# Patient Record
Sex: Female | Born: 1940
Health system: Southern US, Community
[De-identification: ages and names within clinical notes are randomized; demographics above are authoritative.]

## PROBLEM LIST (undated history)

## (undated) DIAGNOSIS — R519 Headache, unspecified: Secondary | ICD-10-CM

## (undated) DIAGNOSIS — N390 Urinary tract infection, site not specified: Secondary | ICD-10-CM

## (undated) DIAGNOSIS — M810 Age-related osteoporosis without current pathological fracture: Secondary | ICD-10-CM

## (undated) HISTORY — DX: Age-related osteoporosis without current pathological fracture: M81.0

## (undated) HISTORY — PX: ABDOMINAL HYSTERECTOMY: SHX81

## (undated) HISTORY — DX: Headache, unspecified: R51.9

---

## 2001-09-24 ENCOUNTER — Other Ambulatory Visit: Admission: RE | Admit: 2001-09-24 | Discharge: 2001-09-24 | Payer: Self-pay | Admitting: Family Medicine

## 2002-10-29 ENCOUNTER — Encounter: Payer: Self-pay | Admitting: Family Medicine

## 2002-10-29 ENCOUNTER — Encounter: Admission: RE | Admit: 2002-10-29 | Discharge: 2002-10-29 | Payer: Self-pay | Admitting: Family Medicine

## 2003-11-08 ENCOUNTER — Encounter: Admission: RE | Admit: 2003-11-08 | Discharge: 2003-11-08 | Payer: Self-pay | Admitting: Internal Medicine

## 2004-01-03 ENCOUNTER — Other Ambulatory Visit: Admission: RE | Admit: 2004-01-03 | Discharge: 2004-01-03 | Payer: Self-pay | Admitting: Internal Medicine

## 2004-03-27 ENCOUNTER — Ambulatory Visit (HOSPITAL_COMMUNITY): Admission: RE | Admit: 2004-03-27 | Discharge: 2004-03-27 | Payer: Self-pay | Admitting: Gastroenterology

## 2005-01-22 ENCOUNTER — Encounter: Admission: RE | Admit: 2005-01-22 | Discharge: 2005-01-22 | Payer: Self-pay | Admitting: Internal Medicine

## 2006-06-10 ENCOUNTER — Encounter: Admission: RE | Admit: 2006-06-10 | Discharge: 2006-06-10 | Payer: Self-pay | Admitting: Internal Medicine

## 2006-06-10 ENCOUNTER — Other Ambulatory Visit: Admission: RE | Admit: 2006-06-10 | Discharge: 2006-06-10 | Payer: Self-pay | Admitting: Internal Medicine

## 2007-07-24 ENCOUNTER — Encounter: Admission: RE | Admit: 2007-07-24 | Discharge: 2007-07-24 | Payer: Self-pay | Admitting: Internal Medicine

## 2008-10-08 ENCOUNTER — Encounter: Admission: RE | Admit: 2008-10-08 | Discharge: 2008-10-08 | Payer: Self-pay | Admitting: Internal Medicine

## 2009-07-24 ENCOUNTER — Emergency Department (HOSPITAL_COMMUNITY): Admission: EM | Admit: 2009-07-24 | Discharge: 2009-07-24 | Payer: Self-pay | Admitting: Family Medicine

## 2009-11-18 ENCOUNTER — Encounter: Admission: RE | Admit: 2009-11-18 | Discharge: 2009-11-18 | Payer: Self-pay | Admitting: Internal Medicine

## 2010-12-12 ENCOUNTER — Other Ambulatory Visit: Payer: Self-pay | Admitting: Internal Medicine

## 2010-12-12 DIAGNOSIS — Z1231 Encounter for screening mammogram for malignant neoplasm of breast: Secondary | ICD-10-CM

## 2011-01-02 ENCOUNTER — Ambulatory Visit
Admission: RE | Admit: 2011-01-02 | Discharge: 2011-01-02 | Disposition: A | Discharge status: Home / Self Care | Payer: Medicare Other | Source: Ambulatory Visit | Attending: Internal Medicine | Admitting: Internal Medicine

## 2011-01-02 DIAGNOSIS — Z1231 Encounter for screening mammogram for malignant neoplasm of breast: Secondary | ICD-10-CM

## 2011-01-19 NOTE — Op Note (Signed)
NAME:  Regina Moody, XU                       ACCOUNT NO.:  [id number]   MEDICAL RECORD NO.:  [id number]                   PATIENT TYPE:  AMB   LOCATION:  ENDO                                 FACILITY:  [facility]   PHYSICIAN:  Danise Edge, M.D.                DATE OF BIRTH:  [birth year]   DATE OF PROCEDURE:  03/27/2004  DATE OF DISCHARGE:                                 OPERATIVE REPORT   PROCEDURE:  Incomplete colonoscopy.   PROCEDURE INDICATION:  [name] is a 70 year old female, born  [birth year].  [name] is scheduled to undergo her first  screening colonoscopy with polypectomy to prevent colon cancer.  Her son  died at age 68 of colon cancer.  [name] has undergone a total  abdominal hysterectomy.   ENDOSCOPIST:  Danise Edge, M.D.   PREMEDICATION:  1. Versed 5 mg.  2. Demerol 50 mg.   DESCRIPTION OF PROCEDURE:  After obtaining informed consent, [name]  was placed in the left lateral decubitus position.  I administered  intravenous Demerol and intravenous Versed to achieve conscious sedation for  the procedure.  The patient's blood pressure, oxygen saturation, and cardiac  rhythm were monitored throughout the procedure and documented in the medical  record.   Anal inspection and digital rectal exam were normal.  The Olympus adjustable  pediatric colonoscope was introduced into the rectum and advanced only to 30  cm.  Due to probable adhesions from her previous hysterectomy, I was unable  to complete a full colonoscopy.   Endoscopic appearance of the rectum and distal sigmoid colon was normal.  There was no endoscopic evidence for the presence of colorectal neoplasia.   I am referring [name] to the [facility] for  an air contrast barium enema.                                               Danise Edge, M.D.    MJ/MEDQ  D:  03/27/2004  T:  03/27/2004  Job:  [id number]   cc:   Georgann Housekeeper, M.D.  [address]  [place]  [place] [address]  Fax: [phone]

## 2011-09-18 DIAGNOSIS — E559 Vitamin D deficiency, unspecified: Secondary | ICD-10-CM | POA: Diagnosis not present

## 2011-09-18 DIAGNOSIS — M949 Disorder of cartilage, unspecified: Secondary | ICD-10-CM | POA: Diagnosis not present

## 2011-09-18 DIAGNOSIS — Z23 Encounter for immunization: Secondary | ICD-10-CM | POA: Diagnosis not present

## 2011-09-18 DIAGNOSIS — J309 Allergic rhinitis, unspecified: Secondary | ICD-10-CM | POA: Diagnosis not present

## 2011-09-18 DIAGNOSIS — E785 Hyperlipidemia, unspecified: Secondary | ICD-10-CM | POA: Diagnosis not present

## 2011-09-18 DIAGNOSIS — R319 Hematuria, unspecified: Secondary | ICD-10-CM | POA: Diagnosis not present

## 2011-09-18 DIAGNOSIS — Z0000 Encounter for general adult medical examination without abnormal findings: Secondary | ICD-10-CM | POA: Diagnosis not present

## 2011-10-09 DIAGNOSIS — H52209 Unspecified astigmatism, unspecified eye: Secondary | ICD-10-CM | POA: Diagnosis not present

## 2011-10-09 DIAGNOSIS — H25019 Cortical age-related cataract, unspecified eye: Secondary | ICD-10-CM | POA: Diagnosis not present

## 2011-11-02 DIAGNOSIS — J392 Other diseases of pharynx: Secondary | ICD-10-CM | POA: Diagnosis not present

## 2011-11-02 DIAGNOSIS — J069 Acute upper respiratory infection, unspecified: Secondary | ICD-10-CM | POA: Diagnosis not present

## 2011-11-02 DIAGNOSIS — J210 Acute bronchiolitis due to respiratory syncytial virus: Secondary | ICD-10-CM | POA: Diagnosis not present

## 2011-11-07 DIAGNOSIS — J019 Acute sinusitis, unspecified: Secondary | ICD-10-CM | POA: Diagnosis not present

## 2011-11-22 DIAGNOSIS — R05 Cough: Secondary | ICD-10-CM | POA: Diagnosis not present

## 2012-08-12 DIAGNOSIS — L989 Disorder of the skin and subcutaneous tissue, unspecified: Secondary | ICD-10-CM | POA: Diagnosis not present

## 2012-08-14 DIAGNOSIS — C44721 Squamous cell carcinoma of skin of unspecified lower limb, including hip: Secondary | ICD-10-CM | POA: Diagnosis not present

## 2012-09-11 ENCOUNTER — Other Ambulatory Visit: Payer: Self-pay | Admitting: Internal Medicine

## 2012-09-11 DIAGNOSIS — Z1231 Encounter for screening mammogram for malignant neoplasm of breast: Secondary | ICD-10-CM

## 2012-09-15 DIAGNOSIS — Z23 Encounter for immunization: Secondary | ICD-10-CM | POA: Diagnosis not present

## 2012-09-23 DIAGNOSIS — Z1331 Encounter for screening for depression: Secondary | ICD-10-CM | POA: Diagnosis not present

## 2012-09-23 DIAGNOSIS — R21 Rash and other nonspecific skin eruption: Secondary | ICD-10-CM | POA: Diagnosis not present

## 2012-09-23 DIAGNOSIS — M949 Disorder of cartilage, unspecified: Secondary | ICD-10-CM | POA: Diagnosis not present

## 2012-09-23 DIAGNOSIS — E785 Hyperlipidemia, unspecified: Secondary | ICD-10-CM | POA: Diagnosis not present

## 2012-09-23 DIAGNOSIS — Z0000 Encounter for general adult medical examination without abnormal findings: Secondary | ICD-10-CM | POA: Diagnosis not present

## 2012-10-06 ENCOUNTER — Ambulatory Visit
Admission: RE | Admit: 2012-10-06 | Discharge: 2012-10-06 | Disposition: A | Discharge status: Home / Self Care | Payer: Medicare Other | Source: Ambulatory Visit | Attending: Internal Medicine | Admitting: Internal Medicine

## 2012-10-06 DIAGNOSIS — Z1231 Encounter for screening mammogram for malignant neoplasm of breast: Secondary | ICD-10-CM | POA: Diagnosis not present

## 2012-10-06 DIAGNOSIS — L578 Other skin changes due to chronic exposure to nonionizing radiation: Secondary | ICD-10-CM | POA: Diagnosis not present

## 2012-10-06 DIAGNOSIS — Z85828 Personal history of other malignant neoplasm of skin: Secondary | ICD-10-CM | POA: Diagnosis not present

## 2012-10-27 DIAGNOSIS — M949 Disorder of cartilage, unspecified: Secondary | ICD-10-CM | POA: Diagnosis not present

## 2012-11-10 DIAGNOSIS — J019 Acute sinusitis, unspecified: Secondary | ICD-10-CM | POA: Diagnosis not present

## 2012-12-23 DIAGNOSIS — L570 Actinic keratosis: Secondary | ICD-10-CM | POA: Diagnosis not present

## 2013-02-03 DIAGNOSIS — L570 Actinic keratosis: Secondary | ICD-10-CM | POA: Diagnosis not present

## 2013-02-03 DIAGNOSIS — L578 Other skin changes due to chronic exposure to nonionizing radiation: Secondary | ICD-10-CM | POA: Diagnosis not present

## 2013-08-12 DIAGNOSIS — L570 Actinic keratosis: Secondary | ICD-10-CM | POA: Diagnosis not present

## 2013-08-12 DIAGNOSIS — D485 Neoplasm of uncertain behavior of skin: Secondary | ICD-10-CM | POA: Diagnosis not present

## 2013-08-12 DIAGNOSIS — C44721 Squamous cell carcinoma of skin of unspecified lower limb, including hip: Secondary | ICD-10-CM | POA: Diagnosis not present

## 2013-09-01 DIAGNOSIS — Z23 Encounter for immunization: Secondary | ICD-10-CM | POA: Diagnosis not present

## 2013-09-25 DIAGNOSIS — Z0000 Encounter for general adult medical examination without abnormal findings: Secondary | ICD-10-CM | POA: Diagnosis not present

## 2013-09-25 DIAGNOSIS — M899 Disorder of bone, unspecified: Secondary | ICD-10-CM | POA: Diagnosis not present

## 2013-09-25 DIAGNOSIS — M949 Disorder of cartilage, unspecified: Secondary | ICD-10-CM | POA: Diagnosis not present

## 2013-09-25 DIAGNOSIS — Z1331 Encounter for screening for depression: Secondary | ICD-10-CM | POA: Diagnosis not present

## 2013-09-25 DIAGNOSIS — E785 Hyperlipidemia, unspecified: Secondary | ICD-10-CM | POA: Diagnosis not present

## 2013-09-28 DIAGNOSIS — Z85828 Personal history of other malignant neoplasm of skin: Secondary | ICD-10-CM | POA: Diagnosis not present

## 2013-09-28 DIAGNOSIS — L570 Actinic keratosis: Secondary | ICD-10-CM | POA: Diagnosis not present

## 2013-11-18 ENCOUNTER — Other Ambulatory Visit: Payer: Self-pay

## 2013-11-18 DIAGNOSIS — Z1231 Encounter for screening mammogram for malignant neoplasm of breast: Secondary | ICD-10-CM

## 2013-11-30 ENCOUNTER — Ambulatory Visit
Admission: RE | Admit: 2013-11-30 | Discharge: 2013-11-30 | Disposition: A | Discharge status: Home / Self Care | Payer: Medicare Other | Source: Ambulatory Visit

## 2013-11-30 DIAGNOSIS — Z1231 Encounter for screening mammogram for malignant neoplasm of breast: Secondary | ICD-10-CM | POA: Diagnosis not present

## 2014-03-29 DIAGNOSIS — Z85828 Personal history of other malignant neoplasm of skin: Secondary | ICD-10-CM | POA: Diagnosis not present

## 2014-03-29 DIAGNOSIS — L570 Actinic keratosis: Secondary | ICD-10-CM | POA: Diagnosis not present

## 2014-04-23 DIAGNOSIS — N200 Calculus of kidney: Secondary | ICD-10-CM | POA: Diagnosis not present

## 2014-05-04 DIAGNOSIS — N3020 Other chronic cystitis without hematuria: Secondary | ICD-10-CM | POA: Diagnosis not present

## 2014-05-07 DIAGNOSIS — R05 Cough: Secondary | ICD-10-CM | POA: Diagnosis not present

## 2014-05-07 DIAGNOSIS — J309 Allergic rhinitis, unspecified: Secondary | ICD-10-CM | POA: Diagnosis not present

## 2014-05-07 DIAGNOSIS — R059 Cough, unspecified: Secondary | ICD-10-CM | POA: Diagnosis not present

## 2014-11-11 DIAGNOSIS — M8580 Other specified disorders of bone density and structure, unspecified site: Secondary | ICD-10-CM | POA: Diagnosis not present

## 2014-11-11 DIAGNOSIS — N3020 Other chronic cystitis without hematuria: Secondary | ICD-10-CM | POA: Diagnosis not present

## 2014-11-11 DIAGNOSIS — Z0000 Encounter for general adult medical examination without abnormal findings: Secondary | ICD-10-CM | POA: Diagnosis not present

## 2014-11-11 DIAGNOSIS — J309 Allergic rhinitis, unspecified: Secondary | ICD-10-CM | POA: Diagnosis not present

## 2014-11-11 DIAGNOSIS — E780 Pure hypercholesterolemia: Secondary | ICD-10-CM | POA: Diagnosis not present

## 2014-11-11 DIAGNOSIS — Z1389 Encounter for screening for other disorder: Secondary | ICD-10-CM | POA: Diagnosis not present

## 2014-11-11 DIAGNOSIS — Z23 Encounter for immunization: Secondary | ICD-10-CM | POA: Diagnosis not present

## 2014-11-11 DIAGNOSIS — R312 Other microscopic hematuria: Secondary | ICD-10-CM | POA: Diagnosis not present

## 2014-11-15 DIAGNOSIS — Z1211 Encounter for screening for malignant neoplasm of colon: Secondary | ICD-10-CM | POA: Diagnosis not present

## 2014-11-15 DIAGNOSIS — L570 Actinic keratosis: Secondary | ICD-10-CM | POA: Diagnosis not present

## 2014-12-13 DIAGNOSIS — L570 Actinic keratosis: Secondary | ICD-10-CM | POA: Diagnosis not present

## 2014-12-27 DIAGNOSIS — H25013 Cortical age-related cataract, bilateral: Secondary | ICD-10-CM | POA: Diagnosis not present

## 2014-12-27 DIAGNOSIS — H5203 Hypermetropia, bilateral: Secondary | ICD-10-CM | POA: Diagnosis not present

## 2015-01-28 ENCOUNTER — Other Ambulatory Visit: Payer: Self-pay

## 2015-01-28 DIAGNOSIS — Z1231 Encounter for screening mammogram for malignant neoplasm of breast: Secondary | ICD-10-CM

## 2015-02-02 ENCOUNTER — Ambulatory Visit
Admission: RE | Admit: 2015-02-02 | Discharge: 2015-02-02 | Disposition: A | Discharge status: Home / Self Care | Payer: Medicare Other | Source: Ambulatory Visit

## 2015-02-02 DIAGNOSIS — Z1231 Encounter for screening mammogram for malignant neoplasm of breast: Secondary | ICD-10-CM | POA: Diagnosis not present

## 2015-02-02 DIAGNOSIS — L570 Actinic keratosis: Secondary | ICD-10-CM | POA: Diagnosis not present

## 2015-05-26 DIAGNOSIS — L570 Actinic keratosis: Secondary | ICD-10-CM | POA: Diagnosis not present

## 2015-07-18 DIAGNOSIS — J040 Acute laryngitis: Secondary | ICD-10-CM | POA: Diagnosis not present

## 2015-07-18 DIAGNOSIS — J069 Acute upper respiratory infection, unspecified: Secondary | ICD-10-CM | POA: Diagnosis not present

## 2015-10-10 DIAGNOSIS — L578 Other skin changes due to chronic exposure to nonionizing radiation: Secondary | ICD-10-CM | POA: Diagnosis not present

## 2015-10-10 DIAGNOSIS — L570 Actinic keratosis: Secondary | ICD-10-CM | POA: Diagnosis not present

## 2015-10-29 DIAGNOSIS — J3089 Other allergic rhinitis: Secondary | ICD-10-CM | POA: Diagnosis not present

## 2015-11-14 DIAGNOSIS — Z1389 Encounter for screening for other disorder: Secondary | ICD-10-CM | POA: Diagnosis not present

## 2015-11-14 DIAGNOSIS — M8580 Other specified disorders of bone density and structure, unspecified site: Secondary | ICD-10-CM | POA: Diagnosis not present

## 2015-11-14 DIAGNOSIS — Z0000 Encounter for general adult medical examination without abnormal findings: Secondary | ICD-10-CM | POA: Diagnosis not present

## 2015-11-14 DIAGNOSIS — G43909 Migraine, unspecified, not intractable, without status migrainosus: Secondary | ICD-10-CM | POA: Diagnosis not present

## 2015-11-14 DIAGNOSIS — N3020 Other chronic cystitis without hematuria: Secondary | ICD-10-CM | POA: Diagnosis not present

## 2015-11-14 DIAGNOSIS — J309 Allergic rhinitis, unspecified: Secondary | ICD-10-CM | POA: Diagnosis not present

## 2015-11-14 DIAGNOSIS — E7800 Pure hypercholesterolemia, unspecified: Secondary | ICD-10-CM | POA: Diagnosis not present

## 2015-11-14 DIAGNOSIS — R3121 Asymptomatic microscopic hematuria: Secondary | ICD-10-CM | POA: Diagnosis not present

## 2015-11-22 DIAGNOSIS — J069 Acute upper respiratory infection, unspecified: Secondary | ICD-10-CM | POA: Diagnosis not present

## 2015-11-22 DIAGNOSIS — J329 Chronic sinusitis, unspecified: Secondary | ICD-10-CM | POA: Diagnosis not present

## 2016-01-02 DIAGNOSIS — M810 Age-related osteoporosis without current pathological fracture: Secondary | ICD-10-CM | POA: Diagnosis not present

## 2016-01-12 DIAGNOSIS — L570 Actinic keratosis: Secondary | ICD-10-CM | POA: Diagnosis not present

## 2016-01-12 DIAGNOSIS — L821 Other seborrheic keratosis: Secondary | ICD-10-CM | POA: Diagnosis not present

## 2016-01-12 DIAGNOSIS — D1801 Hemangioma of skin and subcutaneous tissue: Secondary | ICD-10-CM | POA: Diagnosis not present

## 2016-03-05 ENCOUNTER — Other Ambulatory Visit: Payer: Self-pay | Admitting: Internal Medicine

## 2016-03-05 DIAGNOSIS — Z1231 Encounter for screening mammogram for malignant neoplasm of breast: Secondary | ICD-10-CM

## 2016-03-07 ENCOUNTER — Encounter (HOSPITAL_COMMUNITY): Payer: Self-pay

## 2016-03-07 ENCOUNTER — Emergency Department (HOSPITAL_COMMUNITY)
Admission: EM | Admit: 2016-03-07 | Discharge: 2016-03-07 | Disposition: A | Discharge status: Home / Self Care | Payer: Medicare Other | Attending: Emergency Medicine | Admitting: Emergency Medicine

## 2016-03-07 DIAGNOSIS — R531 Weakness: Secondary | ICD-10-CM | POA: Insufficient documentation

## 2016-03-07 DIAGNOSIS — R112 Nausea with vomiting, unspecified: Secondary | ICD-10-CM | POA: Diagnosis not present

## 2016-03-07 LAB — HEPATIC FUNCTION PANEL
ALK PHOS: 63 U/L (ref 38–126)
ALT: 15 U/L (ref 14–54)
AST: 25 U/L (ref 15–41)
Albumin: 3.8 g/dL (ref 3.5–5.0)
BILIRUBIN DIRECT: 0.1 mg/dL (ref 0.1–0.5)
BILIRUBIN INDIRECT: 0.5 mg/dL (ref 0.3–0.9)
BILIRUBIN TOTAL: 0.6 mg/dL (ref 0.3–1.2)
TOTAL PROTEIN: 6.6 g/dL (ref 6.5–8.1)

## 2016-03-07 LAB — URINALYSIS, ROUTINE W REFLEX MICROSCOPIC
Bilirubin Urine: NEGATIVE
GLUCOSE, UA: NEGATIVE mg/dL
Ketones, ur: NEGATIVE mg/dL
LEUKOCYTES UA: NEGATIVE
Nitrite: NEGATIVE
PROTEIN: NEGATIVE mg/dL
Specific Gravity, Urine: 1.01 (ref 1.005–1.030)
pH: 7 (ref 5.0–8.0)

## 2016-03-07 LAB — BASIC METABOLIC PANEL
ANION GAP: 6 (ref 5–15)
BUN: 14 mg/dL (ref 6–20)
CHLORIDE: 107 mmol/L (ref 101–111)
CO2: 27 mmol/L (ref 22–32)
CREATININE: 0.7 mg/dL (ref 0.44–1.00)
Calcium: 9.6 mg/dL (ref 8.9–10.3)
GFR calc non Af Amer: >60 mL/min (ref >60)
Glucose, Bld: 102 mg/dL — ABNORMAL HIGH (ref 65–99)
POTASSIUM: 4.6 mmol/L (ref 3.5–5.1)
SODIUM: 140 mmol/L (ref 135–145)

## 2016-03-07 LAB — URINE MICROSCOPIC-ADD ON: Squamous Epithelial / LPF: NONE SEEN

## 2016-03-07 LAB — CBC
HEMATOCRIT: 40.8 % (ref 36.0–46.0)
Hemoglobin: 13.3 g/dL (ref 12.0–15.0)
MCH: 31.4 pg (ref 26.0–34.0)
MCHC: 32.6 g/dL (ref 30.0–36.0)
MCV: 96.2 fL (ref 78.0–100.0)
PLATELETS: 202 10*3/uL (ref 150–400)
RBC: 4.24 MIL/uL (ref 3.87–5.11)
RDW: 13.4 % (ref 11.5–15.5)
WBC: 6.8 10*3/uL (ref 4.0–10.5)

## 2016-03-07 LAB — CBG MONITORING, ED: Glucose-Capillary: 117 mg/dL — ABNORMAL HIGH (ref 65–99)

## 2016-03-07 LAB — LIPASE, BLOOD: LIPASE: 24 U/L (ref 11–51)

## 2016-03-07 LAB — TSH: TSH: 1.144 u[IU]/mL (ref 0.350–4.500)

## 2016-03-07 LAB — I-STAT TROPONIN, ED: Troponin i, poc: 0 ng/mL (ref 0.00–0.08)

## 2016-03-07 MED ORDER — SODIUM CHLORIDE 0.9 % IV BOLUS (SEPSIS)
1000.0000 mL | Freq: Once | INTRAVENOUS | Status: AC
  Administered 2016-03-07: 1000 mL via INTRAVENOUS

## 2016-03-07 NOTE — Discharge Instructions (Signed)
Follow up with your family doc. Follow up with urology for hematuria.   Weakness Weakness is a lack of strength. It may be felt all over the body (generalized) or in one specific part of the body (focal). Some causes of weakness can be serious. You may need further medical evaluation, especially if you are elderly or you have a history of immunosuppression (such as chemotherapy or HIV), kidney disease, heart disease, or diabetes. CAUSES  Weakness can be caused by many different things, including:  Infection.  Physical exhaustion.  Internal bleeding or other blood loss that results in a lack of red blood cells (anemia).  Dehydration. This cause is more common in elderly people.  Side effects or electrolyte abnormalities from medicines, such as pain medicines or sedatives.  Emotional distress, anxiety, or depression.  Circulation problems, especially severe peripheral arterial disease.  Heart disease, such as rapid atrial fibrillation, bradycardia, or heart failure.  Nervous system disorders, such as Guillain-Barr syndrome, multiple sclerosis, or stroke. DIAGNOSIS  To find the cause of your weakness, your caregiver will take your history and perform a physical exam. Lab tests or X-rays may also be ordered, if needed. TREATMENT  Treatment of weakness depends on the cause of your symptoms and can vary greatly. HOME CARE INSTRUCTIONS   Rest as needed.  Eat a well-balanced diet.  Try to get some exercise every day.  Only take over-the-counter or prescription medicines as directed by your caregiver. SEEK MEDICAL CARE IF:   Your weakness seems to be getting worse or spreads to other parts of your body.  You develop new aches or pains. SEEK IMMEDIATE MEDICAL CARE IF:   You cannot perform your normal daily activities, such as getting dressed and feeding yourself.  You cannot walk up and down stairs, or you feel exhausted when you do so.  You have shortness of breath or chest  pain.  You have difficulty moving parts of your body.  You have weakness in only one area of the body or on only one side of the body.  You have a fever.  You have trouble speaking or swallowing.  You cannot control your bladder or bowel movements.  You have black or bloody vomit or stools. MAKE SURE YOU:  Understand these instructions.  Will watch your condition.  Will get help right away if you are not doing well or get worse.   This information is not intended to replace advice given to you by your health care provider. Make sure you discuss any questions you have with your health care provider.   Document Released: 08/20/2005 Document Revised: 02/19/2012 Document Reviewed: 10/19/2011 Elsevier Interactive Patient Education [employer].

## 2016-03-07 NOTE — ED Notes (Addendum)
Patient complains of intermittent hypotension, chills, and weakness since Thursday. Denies pain. States that she has never had these episodes previously. On arrival alert and oriented, no complaints, states that she is checking her BP every 6 hours

## 2016-03-07 NOTE — ED Provider Notes (Signed)
CSN: [id number]     Arrival date & time 03/07/16  0957 History   First MD Initiated Contact with Patient 03/07/16 1002     Chief Complaint  Patient presents with  . Nausea  . Weakness     (Consider location/radiation/quality/duration/timing/severity/associated sxs/prior Treatment) Patient is a 75 y.o. female presenting with weakness. The history is provided by the patient.  Weakness This is a new problem. The current episode started less than 1 hour ago. The problem occurs constantly. The problem has not changed since onset.Pertinent negatives include no chest pain, no abdominal pain, no headaches and no shortness of breath. Nothing aggravates the symptoms. Nothing relieves the symptoms. She has tried nothing for the symptoms. The treatment provided no relief.    75 yo F with weakness for 1 week.  Worsening with standing. Denies chest pain, sob, cough, fever, abdominal pain, nausea, vomiting, diarrhea. Denies dark stools or blood in stool.   History reviewed. No pertinent past medical history. History reviewed. No pertinent past surgical history. No family history on file. Social History  Substance Use Topics  . Smoking status: Never Smoker   . Smokeless tobacco: None  . Alcohol Use: None   OB History    No data available     Review of Systems  Constitutional: Positive for fatigue. Negative for fever and chills.  HENT: Negative for congestion and rhinorrhea.   Eyes: Negative for redness and visual disturbance.  Respiratory: Negative for shortness of breath and wheezing.   Cardiovascular: Negative for chest pain and palpitations.  Gastrointestinal: Positive for nausea. Negative for vomiting and abdominal pain.  Genitourinary: Negative for dysuria and urgency.  Musculoskeletal: Negative for myalgias and arthralgias.  Skin: Negative for pallor and wound.  Neurological: Positive for weakness and light-headedness. Negative for dizziness and headaches.      Allergies  Review  of patient's allergies indicates no known allergies.  Home Medications   Prior to Admission medications   Medication Sig Start Date End Date Taking? Authorizing Provider  ibuprofen (ADVIL) 200 MG tablet Take 200 mg by mouth every 6 (six) hours as needed for mild pain.   Yes Historical Provider, MD   BP 107/58 mmHg  Pulse 64  Temp(Src) 97.6 F (36.4 C) (Oral)  Resp 9  Ht 4\' 11"  (1.499 m)  Wt 97 lb (43.999 kg)  BMI 19.58 kg/m2  SpO2 100% Physical Exam  Constitutional: She is oriented to person, place, and time. She appears well-developed and well-nourished. No distress.  HENT:  Head: Normocephalic and atraumatic.  Eyes: EOM are normal. Pupils are equal, round, and reactive to light.  Neck: Normal range of motion. Neck supple.  Cardiovascular: Normal rate and regular rhythm.  Exam reveals no gallop and no friction rub.   No murmur heard. Pulmonary/Chest: Effort normal. She has no wheezes. She has no rales.  Abdominal: Soft. She exhibits no distension. There is no tenderness.  Musculoskeletal: She exhibits no edema or tenderness.  Neurological: She is alert and oriented to person, place, and time.  Skin: Skin is warm and dry. She is not diaphoretic.  Psychiatric: She has a normal mood and affect. Her behavior is normal.  Nursing note and vitals reviewed.   ED Course  Procedures (including critical care time) Labs Review Labs Reviewed  BASIC METABOLIC PANEL - Abnormal; Notable for the following:    Glucose, Bld 102 (*)    All other components within normal limits  URINALYSIS, ROUTINE W REFLEX MICROSCOPIC (NOT AT [facility]) - Abnormal; Notable for  the following:    Hgb urine dipstick LARGE (*)    All other components within normal limits  URINE MICROSCOPIC-ADD ON - Abnormal; Notable for the following:    Bacteria, UA RARE (*)    All other components within normal limits  CBG MONITORING, ED - Abnormal; Notable for the following:    Glucose-Capillary 117 (*)    All other  components within normal limits  CBC  HEPATIC FUNCTION PANEL  LIPASE, BLOOD  TSH  I-STAT TROPOININ, ED    Imaging Review No results found. I have personally reviewed and evaluated these images and lab results as part of my medical decision-making.   EKG Interpretation   Date/Time:  Wednesday March 07 2016 10:02:46 EDT Ventricular Rate:  80 PR Interval:  128 QRS Duration: 76 QT Interval:  366 QTC Calculation: 422 R Axis:   77 Text Interpretation:  Normal sinus rhythm Normal ECG No old tracing to  compare Confirmed by Daegan Arizmendi MD, DANIEL [phone]) on 03/07/2016 10:07:15 AM      MDM   Final diagnoses:  Weakness    75 yo F with fatigue.  Patient had a laboratory workup done here with hematuria but no infection. Otherwise reassuring. Patient will follow with her family doctor and urology.  1:52 PM:  I have discussed the diagnosis/risks/treatment options with the patient and believe the pt to be eligible for discharge home to follow-up with PCP. We also discussed returning to the ED immediately if new or worsening sx occur. We discussed the sx which are most concerning (e.g., sudden worsening pain, fever, inability to tolerate by mouth) that necessitate immediate return. Medications administered to the patient during their visit and any new prescriptions provided to the patient are listed below.  Medications given during this visit Medications  sodium chloride 0.9 % bolus 1,000 mL (0 mLs Intravenous Stopped 03/07/16 1250)    Discharge Medication List as of 03/07/2016 12:41 PM      The patient appears reasonably screen and/or stabilized for discharge and I doubt any other medical condition or other [facility] requiring further screening, evaluation, or treatment in the ED at this time prior to discharge.      Deno Etienne, DO 03/07/16 1352

## 2016-03-15 ENCOUNTER — Ambulatory Visit
Admission: RE | Admit: 2016-03-15 | Discharge: 2016-03-15 | Disposition: A | Discharge status: Home / Self Care | Payer: Medicare Other | Source: Ambulatory Visit | Attending: Internal Medicine | Admitting: Internal Medicine

## 2016-03-15 DIAGNOSIS — H2513 Age-related nuclear cataract, bilateral: Secondary | ICD-10-CM | POA: Diagnosis not present

## 2016-03-15 DIAGNOSIS — Z1231 Encounter for screening mammogram for malignant neoplasm of breast: Secondary | ICD-10-CM | POA: Diagnosis not present

## 2016-03-15 DIAGNOSIS — H5203 Hypermetropia, bilateral: Secondary | ICD-10-CM | POA: Diagnosis not present

## 2016-04-09 DIAGNOSIS — L570 Actinic keratosis: Secondary | ICD-10-CM | POA: Diagnosis not present

## 2016-04-13 DIAGNOSIS — M545 Low back pain: Secondary | ICD-10-CM | POA: Diagnosis not present

## 2016-07-02 DIAGNOSIS — N390 Urinary tract infection, site not specified: Secondary | ICD-10-CM | POA: Diagnosis not present

## 2016-07-02 DIAGNOSIS — Z23 Encounter for immunization: Secondary | ICD-10-CM | POA: Diagnosis not present

## 2016-10-08 DIAGNOSIS — L570 Actinic keratosis: Secondary | ICD-10-CM | POA: Diagnosis not present

## 2016-10-30 ENCOUNTER — Encounter (HOSPITAL_COMMUNITY): Payer: Self-pay | Admitting: *Deleted

## 2016-10-30 ENCOUNTER — Emergency Department (HOSPITAL_COMMUNITY)
Admission: EM | Admit: 2016-10-30 | Discharge: 2016-10-30 | Disposition: A | Discharge status: Home / Self Care | Payer: Medicare Other | Attending: Emergency Medicine | Admitting: Emergency Medicine

## 2016-10-30 ENCOUNTER — Emergency Department (HOSPITAL_COMMUNITY): Payer: Medicare Other

## 2016-10-30 DIAGNOSIS — X501XXA Overexertion from prolonged static or awkward postures, initial encounter: Secondary | ICD-10-CM | POA: Insufficient documentation

## 2016-10-30 DIAGNOSIS — S93402A Sprain of unspecified ligament of left ankle, initial encounter: Secondary | ICD-10-CM

## 2016-10-30 DIAGNOSIS — Y929 Unspecified place or not applicable: Secondary | ICD-10-CM | POA: Diagnosis not present

## 2016-10-30 DIAGNOSIS — Y9301 Activity, walking, marching and hiking: Secondary | ICD-10-CM | POA: Diagnosis not present

## 2016-10-30 DIAGNOSIS — Y999 Unspecified external cause status: Secondary | ICD-10-CM | POA: Insufficient documentation

## 2016-10-30 DIAGNOSIS — M25572 Pain in left ankle and joints of left foot: Secondary | ICD-10-CM | POA: Diagnosis not present

## 2016-10-30 DIAGNOSIS — S99912A Unspecified injury of left ankle, initial encounter: Secondary | ICD-10-CM | POA: Diagnosis not present

## 2016-10-30 NOTE — ED Triage Notes (Signed)
Pt states she was walking down the steps and missed the bottom step. She twisted her left ankle. Pt is ambulatory upon triage. NAD noted.

## 2016-10-30 NOTE — ED Provider Notes (Signed)
This is his source is his son as he is oriented to be normal with a viral lower alert and oriented and he get him  [facility]-EMER[facility]Y DEPT Provider Note   CSN: [id number] Arrival date & time: 10/30/16  1548     History   Chief Complaint Chief Complaint  Patient presents with  . Ankle Pain    HPI Regina Moody is a 76 y.o. female.  Patient is 76 year old female who presents to the emergency department with a complaint of left ankle pain.  Patient reports walking down steps, she missed a step, and twisted the left ankle. There no other injuries reported. The patient has been walking since the incident. She's noted some swelling and is concerned about her ankle. The patient denies being on any anticoagulation medications. She has no history of any bleeding disorders. She's not had any recent operations or procedures involving the left lower extremity.      History reviewed. No pertinent past medical history.  There are no active problems to display for this patient.   Past Surgical History:  Procedure Laterality Date  . ABDOMINAL HYSTERECTOMY    . CESAREAN SECTION      OB History    No data available       Home Medications    Prior to Admission medications   Medication Sig Start Date End Date Taking? Authorizing Provider  ibuprofen (ADVIL) 200 MG tablet Take 200 mg by mouth every 6 (six) hours as needed for mild pain.    Historical Provider, MD    Family History No family history on file.  Social History Social History  Substance Use Topics  . Smoking status: Never Smoker  . Smokeless tobacco: Never Used  . Alcohol use No     Allergies   Patient has no known allergies.   Review of Systems Review of Systems  Musculoskeletal: Positive for arthralgias.  All other systems reviewed and are negative.    Physical Exam Updated Vital Signs BP (!) 118/53 (BP Location: Right Arm)   Pulse 82   Temp 97.6 F (36.4 C) (Oral)   Resp 18   Ht 4\' 11"  (1.499 m)    Wt 44 kg   BMI 19.59 kg/m   Physical Exam  Constitutional: She is oriented to person, place, and time. She appears well-developed and well-nourished.  Non-toxic appearance.  HENT:  Head: Normocephalic.  Right Ear: Tympanic membrane and external ear normal.  Left Ear: Tympanic membrane and external ear normal.  Eyes: EOM and lids are normal. Pupils are equal, round, and reactive to light.  Neck: Normal range of motion. Neck supple. Carotid bruit is not present.  Cardiovascular: Normal rate, regular rhythm, normal heart sounds, intact distal pulses and normal pulses.   Pulmonary/Chest: Breath sounds normal. No respiratory distress.  Abdominal: Soft. Bowel sounds are normal. There is no tenderness. There is no guarding.  Musculoskeletal: Normal range of motion.       Left ankle: Tenderness. Lateral malleolus tenderness found. Achilles tendon normal.  Lymphadenopathy:       Head (right side): No submandibular adenopathy present.       Head (left side): No submandibular adenopathy present.    She has no cervical adenopathy.  Neurological: She is alert and oriented to person, place, and time. She has normal strength. No cranial nerve deficit or sensory deficit.  Skin: Skin is warm and dry.  Psychiatric: She has a normal mood and affect. Her speech is normal.  Nursing note and vitals  reviewed.    ED Treatments / Results  Labs (all labs ordered are listed, but only abnormal results are displayed) Labs Reviewed - No data to display  EKG  EKG Interpretation None       Radiology No results found.  Procedures Procedures (including critical care time)  Medications Ordered in ED Medications - No data to display   Initial Impression / Assessment and Plan / ED Course  I have reviewed the triage vital signs and the nursing notes.  Pertinent labs & imaging results that were available during my care of the patient were reviewed by me and considered in my medical decision making  (see chart for details).     *I have reviewed nursing notes, vital signs, and all appropriate lab and imaging results for this patient.**  Final Clinical Impressions(s) / ED Diagnoses  MDM All of your were done vital signs within normal limits. There no neurovascular deficits appreciated of the lower extremity. X-ray of the left ankle is negative for fracture or dislocation.  The patient will be fitted with an ankle stirrup splint. The patient is to follow-up with her primary physician if any changes or problems. Patient is in agreement with this plan.    Final diagnoses:  Sprain of left ankle, unspecified ligament, initial encounter    New Prescriptions New Prescriptions   No medications on file     Lily Kocher, PA-C 11/01/16 [id number]    Milton Ferguson, MD 11/01/16 1306

## 2016-10-30 NOTE — Discharge Instructions (Signed)
The x-ray of your ankle is negative for fracture or dislocation. Your examination is negative for any acute findings. Your exam does seem to show a left ankle sprain. Please use your ankle stirrup splint over the next 7-10 days. Please use Tylenol every 4 hours for soreness. Please elevated and apply ice is much as possible. See Dr. Aline Brochure, or your primary physician if this is not improving.

## 2016-11-19 DIAGNOSIS — Z1211 Encounter for screening for malignant neoplasm of colon: Secondary | ICD-10-CM | POA: Diagnosis not present

## 2016-11-19 DIAGNOSIS — J309 Allergic rhinitis, unspecified: Secondary | ICD-10-CM | POA: Diagnosis not present

## 2016-11-19 DIAGNOSIS — N3020 Other chronic cystitis without hematuria: Secondary | ICD-10-CM | POA: Diagnosis not present

## 2016-11-19 DIAGNOSIS — M810 Age-related osteoporosis without current pathological fracture: Secondary | ICD-10-CM | POA: Diagnosis not present

## 2016-11-19 DIAGNOSIS — Z0000 Encounter for general adult medical examination without abnormal findings: Secondary | ICD-10-CM | POA: Diagnosis not present

## 2016-11-19 DIAGNOSIS — E7800 Pure hypercholesterolemia, unspecified: Secondary | ICD-10-CM | POA: Diagnosis not present

## 2016-11-19 DIAGNOSIS — G43909 Migraine, unspecified, not intractable, without status migrainosus: Secondary | ICD-10-CM | POA: Diagnosis not present

## 2016-11-19 DIAGNOSIS — Z1389 Encounter for screening for other disorder: Secondary | ICD-10-CM | POA: Diagnosis not present

## 2016-12-04 DIAGNOSIS — Z1211 Encounter for screening for malignant neoplasm of colon: Secondary | ICD-10-CM | POA: Diagnosis not present

## 2016-12-04 DIAGNOSIS — Z1212 Encounter for screening for malignant neoplasm of rectum: Secondary | ICD-10-CM | POA: Diagnosis not present

## 2017-04-08 DIAGNOSIS — L578 Other skin changes due to chronic exposure to nonionizing radiation: Secondary | ICD-10-CM | POA: Diagnosis not present

## 2017-05-21 DIAGNOSIS — H25013 Cortical age-related cataract, bilateral: Secondary | ICD-10-CM | POA: Diagnosis not present

## 2017-05-21 DIAGNOSIS — H5203 Hypermetropia, bilateral: Secondary | ICD-10-CM | POA: Diagnosis not present

## 2017-09-23 DIAGNOSIS — H25813 Combined forms of age-related cataract, bilateral: Secondary | ICD-10-CM | POA: Diagnosis not present

## 2017-09-23 DIAGNOSIS — H5203 Hypermetropia, bilateral: Secondary | ICD-10-CM | POA: Diagnosis not present

## 2017-10-02 DIAGNOSIS — L570 Actinic keratosis: Secondary | ICD-10-CM | POA: Diagnosis not present

## 2017-10-05 DIAGNOSIS — J069 Acute upper respiratory infection, unspecified: Secondary | ICD-10-CM | POA: Diagnosis not present

## 2017-10-10 DIAGNOSIS — H268 Other specified cataract: Secondary | ICD-10-CM | POA: Diagnosis not present

## 2017-10-10 DIAGNOSIS — H25811 Combined forms of age-related cataract, right eye: Secondary | ICD-10-CM | POA: Diagnosis not present

## 2017-10-21 ENCOUNTER — Other Ambulatory Visit: Payer: Self-pay | Admitting: Internal Medicine

## 2017-10-21 DIAGNOSIS — Z139 Encounter for screening, unspecified: Secondary | ICD-10-CM

## 2017-10-22 ENCOUNTER — Ambulatory Visit
Admission: RE | Admit: 2017-10-22 | Discharge: 2017-10-22 | Disposition: A | Discharge status: Home / Self Care | Payer: Medicare Other | Source: Ambulatory Visit | Attending: Internal Medicine | Admitting: Internal Medicine

## 2017-10-22 DIAGNOSIS — Z1231 Encounter for screening mammogram for malignant neoplasm of breast: Secondary | ICD-10-CM | POA: Diagnosis not present

## 2017-10-22 DIAGNOSIS — Z139 Encounter for screening, unspecified: Secondary | ICD-10-CM

## 2017-10-23 DIAGNOSIS — N390 Urinary tract infection, site not specified: Secondary | ICD-10-CM | POA: Diagnosis not present

## 2017-10-31 DIAGNOSIS — H25812 Combined forms of age-related cataract, left eye: Secondary | ICD-10-CM | POA: Diagnosis not present

## 2017-10-31 DIAGNOSIS — H268 Other specified cataract: Secondary | ICD-10-CM | POA: Diagnosis not present

## 2018-01-29 DIAGNOSIS — R3129 Other microscopic hematuria: Secondary | ICD-10-CM | POA: Diagnosis not present

## 2018-01-29 DIAGNOSIS — J309 Allergic rhinitis, unspecified: Secondary | ICD-10-CM | POA: Diagnosis not present

## 2018-01-29 DIAGNOSIS — G43909 Migraine, unspecified, not intractable, without status migrainosus: Secondary | ICD-10-CM | POA: Diagnosis not present

## 2018-01-29 DIAGNOSIS — M810 Age-related osteoporosis without current pathological fracture: Secondary | ICD-10-CM | POA: Diagnosis not present

## 2018-01-29 DIAGNOSIS — Z1389 Encounter for screening for other disorder: Secondary | ICD-10-CM | POA: Diagnosis not present

## 2018-01-29 DIAGNOSIS — Z0000 Encounter for general adult medical examination without abnormal findings: Secondary | ICD-10-CM | POA: Diagnosis not present

## 2018-01-29 DIAGNOSIS — N3020 Other chronic cystitis without hematuria: Secondary | ICD-10-CM | POA: Diagnosis not present

## 2018-03-27 ENCOUNTER — Ambulatory Visit
Admission: RE | Admit: 2018-03-27 | Discharge: 2018-03-27 | Disposition: A | Discharge status: Home / Self Care | Payer: Medicare Other | Source: Ambulatory Visit | Attending: Internal Medicine | Admitting: Internal Medicine

## 2018-03-27 ENCOUNTER — Other Ambulatory Visit: Payer: Self-pay | Admitting: Internal Medicine

## 2018-03-27 DIAGNOSIS — M25551 Pain in right hip: Secondary | ICD-10-CM | POA: Diagnosis not present

## 2018-03-27 DIAGNOSIS — S79911A Unspecified injury of right hip, initial encounter: Secondary | ICD-10-CM | POA: Diagnosis not present

## 2018-03-27 DIAGNOSIS — R1031 Right lower quadrant pain: Secondary | ICD-10-CM | POA: Diagnosis not present

## 2018-03-31 DIAGNOSIS — L570 Actinic keratosis: Secondary | ICD-10-CM | POA: Diagnosis not present

## 2018-03-31 DIAGNOSIS — L814 Other melanin hyperpigmentation: Secondary | ICD-10-CM | POA: Diagnosis not present

## 2018-03-31 DIAGNOSIS — Z85828 Personal history of other malignant neoplasm of skin: Secondary | ICD-10-CM | POA: Diagnosis not present

## 2018-04-07 DIAGNOSIS — R1030 Lower abdominal pain, unspecified: Secondary | ICD-10-CM | POA: Diagnosis not present

## 2018-04-09 DIAGNOSIS — R102 Pelvic and perineal pain: Secondary | ICD-10-CM | POA: Diagnosis not present

## 2018-04-21 DIAGNOSIS — C44529 Squamous cell carcinoma of skin of other part of trunk: Secondary | ICD-10-CM | POA: Diagnosis not present

## 2018-04-21 DIAGNOSIS — D485 Neoplasm of uncertain behavior of skin: Secondary | ICD-10-CM | POA: Diagnosis not present

## 2018-04-21 DIAGNOSIS — L578 Other skin changes due to chronic exposure to nonionizing radiation: Secondary | ICD-10-CM | POA: Diagnosis not present

## 2018-05-15 DIAGNOSIS — C44529 Squamous cell carcinoma of skin of other part of trunk: Secondary | ICD-10-CM | POA: Diagnosis not present

## 2018-07-10 DIAGNOSIS — Z23 Encounter for immunization: Secondary | ICD-10-CM | POA: Diagnosis not present

## 2018-07-20 DIAGNOSIS — M25551 Pain in right hip: Secondary | ICD-10-CM | POA: Diagnosis not present

## 2018-07-20 DIAGNOSIS — M545 Low back pain: Secondary | ICD-10-CM | POA: Diagnosis not present

## 2018-08-11 DIAGNOSIS — M25551 Pain in right hip: Secondary | ICD-10-CM | POA: Diagnosis not present

## 2018-09-05 DIAGNOSIS — M545 Low back pain: Secondary | ICD-10-CM | POA: Diagnosis not present

## 2018-09-05 DIAGNOSIS — M25551 Pain in right hip: Secondary | ICD-10-CM | POA: Diagnosis not present

## 2018-10-01 ENCOUNTER — Other Ambulatory Visit: Payer: Self-pay

## 2018-10-01 ENCOUNTER — Encounter: Payer: Self-pay | Admitting: Physical Therapy

## 2018-10-01 ENCOUNTER — Ambulatory Visit: Payer: Medicare Other | Attending: Family Medicine | Admitting: Physical Therapy

## 2018-10-01 DIAGNOSIS — M62830 Muscle spasm of back: Secondary | ICD-10-CM | POA: Insufficient documentation

## 2018-10-01 DIAGNOSIS — M545 Low back pain: Secondary | ICD-10-CM | POA: Diagnosis not present

## 2018-10-01 DIAGNOSIS — G8929 Other chronic pain: Secondary | ICD-10-CM | POA: Diagnosis not present

## 2018-10-01 NOTE — Therapy (Signed)
[place], [place], [address] Phone: [phone]   Fax:  [phone]  Physical Therapy Evaluation  Patient Details  Name: Regina Moody MRN: [id number] Date of Birth: Jul 05, 1941 Referring Provider (PT): Dr Rhina Brackett   Encounter Date: 10/01/2018  PT End of Session - 10/01/18 1500    Visit Number  1    Number of Visits  4    Date for PT Re-Evaluation  10/29/18    PT Start Time  9983    PT Stop Time  1501    PT Time Calculation (min)  56 min    Activity Tolerance  Patient tolerated treatment well       Past Medical History:  Diagnosis Date  . Osteoporosis     Past Surgical History:  Procedure Laterality Date  . ABDOMINAL HYSTERECTOMY    . CESAREAN SECTION      There were no vitals filed for this visit.   Subjective Assessment - 10/01/18 1405    Subjective  Pt reports she has had back pain since Nov of last year.  She was told she had a fx pelvis after a fall in June/July 2019.  She has been busy with yard work first part of Nov, when she went to shower she turned funny and had severe back pain.  Was in bed for a week then went to MD was a little better.  They feel like her pain is fromthe fx pelvis because it didn't heal right.      Pertinent History  very healty    How long can you sit comfortably?  currently better than before tolerates 2 hr    How long can you walk comfortably?  no limitations    Diagnostic tests  xrays from summer    Patient Stated Goals  lift gas can to put gas in lawn mover ( 3 gallons) return to all her outdoor lawn care.     Currently in Pain?  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      Cognition   Overall Cognitive Status  Within Functional Limits for tasks assessed      Observation/Other Assessments   Focus on Therapeutic Outcomes (FOTO)   37% limited      Functional Tests   Functional tests  Single leg stance      Single Leg Stance   Comments  Rt 8 sec, Lt 3 sec - brought on Rt low back pain      Posture/Postural Control   Posture/Postural Control  No significant  limitations    Posture Comments  very small decreased in thoracic      ROM / Strength   AROM / PROM / Strength  AROM;Strength      AROM   AROM Assessment Site  Hip;Lumbar    Lumbar Flexion  NA d/t osteoporosis    Lumbar Extension  50% no pain    Lumbar - Right Rotation  WNL    Lumbar - Left Rotation  WNL      Strength   Strength Assessment Site  Knee;Hip;Ankle;Lumbar    Right/Left Hip  --   grossly WNL except abduction 4+/5   Right/Left Knee  --   WNL   Right/Left Ankle  --   WNL   Lumbar Flexion  --   TA fair   Lumbar Extension  --   multifidi fair      Flexibility   Soft Tissue Assessment /Muscle Length  --   LEs WNL     Palpation   Spinal mobility  NA d/t osteoporosis    Palpation comment  point tenderness in Lt SIJ and into upper gluts                Objective measurements completed on examination: See above findings.      [facility] PT Treatment/Exercise - 10/01/18 0001      Self-Care   Self-Care  Other Self-Care Comments    Other Self-Care Comments   osteoporosis handout reviewed Answered all patient questions regarding safe body mechanics and movement patterns.     verbally reviewed HEP - time limitations  .            PT Education - 10/01/18 1607    Education Details  HEP, body mechanics, spinal precautions with osteoporosis    Person(s) Educated  Patient    Methods  Explanation;Handout    Comprehension  Verbalized understanding;Need further instruction          PT Long Term Goals - 10/01/18 1613      PT LONG TERM GOAL #1   Title  I with HEP ( 10/29/2018)     Time  4    Period  Weeks    Status  New    Target Date  10/29/18      PT LONG TERM GOAL #2   Title  demo safe body mechanics for yard work and household tasks that will protect her back from reinjury or fx. 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  She fell in late spring and was  told she had a pelvic fx, was not placed on any limitations.  This past Nov after working in her yard she turned and had severe low back pain.  MD told her it all relates to her pelvic fx not fully healing.  she is osteoporotic and has never been educated on this.  Currenlty her strength is WFL, slight decrease through her core.  She has tighness and pain in her gluts and Rt SIJ.  She would benefit from continued education and practice with safe body mechanics to prevent fx and reinjury to back as well as instruction in back of the body stengthening.  She may also benefit from ionto patches to Rt SIJ if pain continues.     History and Personal Factors relevant to plan of care:   osteoporosis    Clinical Presentation  Stable    Clinical Decision Making  Low    Rehab Potential  Excellent    PT Frequency  1x / week    PT Duration  4 weeks    PT Treatment/Interventions  Iontophoresis 4mg /ml Dexamethasone;Manual techniques;Therapeutic exercise;Electrical Stimulation;Cryotherapy;Ultrasound;Moist Heat;Therapeutic activities;Functional mobility training    PT Next Visit Plan  answer any questions regarding handouts from eval. Practice/review HEP and add in glut stretches     Consulted and Agree with Plan of Care  Patient       Patient will benefit from skilled therapeutic intervention in order to improve the following deficits and impairments:  Pain, Increased muscle spasms  Visit Diagnosis: Chronic bilateral low back pain without sciatica - Plan: PT plan of care cert/re-cert  Muscle spasm of back - Plan: PT plan of care cert/re-cert     Problem List There are no active problems to display for this patient.   Jeral Pinch PT  10/01/2018, 4:18 PM  [facility] [address] [place], [place], [address] Phone: [phone]   Fax:  [phone]  Name: Regina Moody MRN: [id number] Date of Birth: 1940-10-03

## 2018-10-02 DIAGNOSIS — Z85828 Personal history of other malignant neoplasm of skin: Secondary | ICD-10-CM | POA: Diagnosis not present

## 2018-10-02 DIAGNOSIS — L570 Actinic keratosis: Secondary | ICD-10-CM | POA: Diagnosis not present

## 2018-10-02 DIAGNOSIS — L905 Scar conditions and fibrosis of skin: Secondary | ICD-10-CM | POA: Diagnosis not present

## 2018-10-10 ENCOUNTER — Encounter: Payer: Medicare Other | Admitting: Physical Therapy

## 2019-02-02 DIAGNOSIS — J309 Allergic rhinitis, unspecified: Secondary | ICD-10-CM | POA: Diagnosis not present

## 2019-02-02 DIAGNOSIS — Z1389 Encounter for screening for other disorder: Secondary | ICD-10-CM | POA: Diagnosis not present

## 2019-02-02 DIAGNOSIS — Z1239 Encounter for other screening for malignant neoplasm of breast: Secondary | ICD-10-CM | POA: Diagnosis not present

## 2019-02-02 DIAGNOSIS — E7800 Pure hypercholesterolemia, unspecified: Secondary | ICD-10-CM | POA: Diagnosis not present

## 2019-02-02 DIAGNOSIS — G43909 Migraine, unspecified, not intractable, without status migrainosus: Secondary | ICD-10-CM | POA: Diagnosis not present

## 2019-02-02 DIAGNOSIS — N3020 Other chronic cystitis without hematuria: Secondary | ICD-10-CM | POA: Diagnosis not present

## 2019-02-02 DIAGNOSIS — M810 Age-related osteoporosis without current pathological fracture: Secondary | ICD-10-CM | POA: Diagnosis not present

## 2019-02-02 DIAGNOSIS — Z0000 Encounter for general adult medical examination without abnormal findings: Secondary | ICD-10-CM | POA: Diagnosis not present

## 2019-02-03 ENCOUNTER — Other Ambulatory Visit: Payer: Self-pay | Admitting: Internal Medicine

## 2019-02-03 DIAGNOSIS — M810 Age-related osteoporosis without current pathological fracture: Secondary | ICD-10-CM

## 2019-02-03 DIAGNOSIS — Z1231 Encounter for screening mammogram for malignant neoplasm of breast: Secondary | ICD-10-CM

## 2019-02-10 DIAGNOSIS — E7800 Pure hypercholesterolemia, unspecified: Secondary | ICD-10-CM | POA: Diagnosis not present

## 2019-02-10 DIAGNOSIS — N3020 Other chronic cystitis without hematuria: Secondary | ICD-10-CM | POA: Diagnosis not present

## 2019-04-06 DIAGNOSIS — L570 Actinic keratosis: Secondary | ICD-10-CM | POA: Diagnosis not present

## 2019-04-06 DIAGNOSIS — Z85828 Personal history of other malignant neoplasm of skin: Secondary | ICD-10-CM | POA: Diagnosis not present

## 2019-04-06 DIAGNOSIS — L814 Other melanin hyperpigmentation: Secondary | ICD-10-CM | POA: Diagnosis not present

## 2019-04-06 DIAGNOSIS — L578 Other skin changes due to chronic exposure to nonionizing radiation: Secondary | ICD-10-CM | POA: Diagnosis not present

## 2019-04-06 DIAGNOSIS — B079 Viral wart, unspecified: Secondary | ICD-10-CM | POA: Diagnosis not present

## 2019-04-06 DIAGNOSIS — C44729 Squamous cell carcinoma of skin of left lower limb, including hip: Secondary | ICD-10-CM | POA: Diagnosis not present

## 2019-04-14 DIAGNOSIS — Z961 Presence of intraocular lens: Secondary | ICD-10-CM | POA: Diagnosis not present

## 2019-04-14 DIAGNOSIS — H5203 Hypermetropia, bilateral: Secondary | ICD-10-CM | POA: Diagnosis not present

## 2019-04-14 DIAGNOSIS — D3132 Benign neoplasm of left choroid: Secondary | ICD-10-CM | POA: Diagnosis not present

## 2019-04-24 ENCOUNTER — Ambulatory Visit
Admission: RE | Admit: 2019-04-24 | Discharge: 2019-04-24 | Disposition: A | Discharge status: Home / Self Care | Payer: Medicare Other | Source: Ambulatory Visit | Attending: Internal Medicine | Admitting: Internal Medicine

## 2019-04-24 ENCOUNTER — Other Ambulatory Visit: Payer: Self-pay

## 2019-04-24 DIAGNOSIS — M810 Age-related osteoporosis without current pathological fracture: Secondary | ICD-10-CM | POA: Diagnosis not present

## 2019-04-24 DIAGNOSIS — M85852 Other specified disorders of bone density and structure, left thigh: Secondary | ICD-10-CM | POA: Diagnosis not present

## 2019-04-24 DIAGNOSIS — Z780 Asymptomatic menopausal state: Secondary | ICD-10-CM | POA: Diagnosis not present

## 2019-04-24 DIAGNOSIS — Z1231 Encounter for screening mammogram for malignant neoplasm of breast: Secondary | ICD-10-CM

## 2019-05-04 DIAGNOSIS — H0015 Chalazion left lower eyelid: Secondary | ICD-10-CM | POA: Diagnosis not present

## 2019-06-04 DIAGNOSIS — R000 Tachycardia, unspecified: Secondary | ICD-10-CM | POA: Diagnosis not present

## 2019-06-12 ENCOUNTER — Other Ambulatory Visit: Payer: Self-pay

## 2019-06-12 ENCOUNTER — Ambulatory Visit
Admission: EM | Admit: 2019-06-12 | Discharge: 2019-06-12 | Disposition: A | Discharge status: Home / Self Care | Payer: Medicare Other | Attending: Emergency Medicine | Admitting: Emergency Medicine

## 2019-06-12 DIAGNOSIS — G43009 Migraine without aura, not intractable, without status migrainosus: Secondary | ICD-10-CM

## 2019-06-12 MED ORDER — DEXAMETHASONE SODIUM PHOSPHATE 10 MG/ML IJ SOLN
10.0000 mg | Freq: Once | INTRAMUSCULAR | Status: AC
  Administered 2019-06-12: 10 mg via INTRAMUSCULAR

## 2019-06-12 MED ORDER — KETOROLAC TROMETHAMINE 30 MG/ML IJ SOLN
30.0000 mg | Freq: Once | INTRAMUSCULAR | Status: AC
  Administered 2019-06-12: 30 mg via INTRAMUSCULAR

## 2019-06-12 MED ORDER — ONDANSETRON 4 MG PO TBDP
4.0000 mg | ORAL_TABLET | Freq: Once | ORAL | Status: AC
  Administered 2019-06-12: 4 mg via ORAL

## 2019-06-12 NOTE — ED Triage Notes (Signed)
Pt has been having intermittent headaches for past couple weeks, unable to be seen it pcp . Pain is relieved with advil

## 2019-06-12 NOTE — Discharge Instructions (Signed)
Declines further work-up in the ED for new migraines.  Will try migraine cocktail and close follow up with PCP next week.   Migraine cocktail given in office Rest and drink plenty of fluids Continue with OTC advil as needed Follow up with PCP next week for recheck and for further evaluation and management of migraines Return, go to the ER, or call 911 if you have any new or worsening symptoms such as fever, chills, nausea, vomiting, chest pain, shortness of breath, cough, vision changes, worsening headache despite treatment, slurred speech, facial asymmetry, weakness in arms or legs, etc..Marland Kitchen

## 2019-06-12 NOTE — ED Provider Notes (Signed)
[place]   WT:[id number] 06/12/19 Arrival Time: 1059  CC: Migraine  SUBJECTIVE:  Regina Moody is a 78 y.o. female who complains of intermittent migraines x 10 days.  Denies a precipitating event, or recent head trauma.  Patient localizes her pain to the left side of head, over the occipital and temporal lobe.  Describes the pain as constant, occurs daily, and sharp "squeezing" in character.  Patient has tried OTC advil without relief. Symptoms are made worse with leaning forward.  Reports similar symptoms in the past that improved with advil, and sleeping.  Reports hx of chronic migraines when she was younger.  Was followed by a neurologist and he told her her migraines would go away after she turned 56 and went through "the change of life."  This is not the worst headache of her life, but states her current migraine has lasted longer than normal.  Patient denies fever, chills, nausea, vomiting, aura, rhinorrhea, watery eyes, chest pain, SOB, abdominal pain, weakness, numbness or tingling, slurred speech, rhinorrhea, congestion, sore throat, cough.    ROS: As per HPI.  All other pertinent ROS negative.     Past Medical History:  Diagnosis Date  . Osteoporosis    Past Surgical History:  Procedure Laterality Date  . ABDOMINAL HYSTERECTOMY    . CESAREAN SECTION     No Known Allergies No current facility-administered medications on file prior to encounter.    Current Outpatient Medications on File Prior to Encounter  Medication Sig Dispense Refill  . ibuprofen (ADVIL) 200 MG tablet Take 200 mg by mouth every 6 (six) hours as needed for mild pain.     Social History   Socioeconomic History  . Marital status: Married    Spouse name: Not on file  . Number of children: Not on file  . Years of education: Not on file  . Highest education level: Not on file  Occupational History  . Not on file  Social Needs  . Financial resource strain: Not on file  . Food insecurity   Worry: Not on file    Inability: Not on file  . Transportation needs    Medical: Not on file    Non-medical: Not on file  Tobacco Use  . Smoking status: Never Smoker  . Smokeless tobacco: Never Used  Substance and Sexual Activity  . Alcohol use: No  . Drug use: Not on file  . Sexual activity: Not on file  Lifestyle  . Physical activity    Days per week: Not on file    Minutes per session: Not on file  . Stress: Not on file  Relationships  . Social Herbalist on phone: Not on file    Gets together: Not on file    Attends religious service: Not on file    Active member of club or organization: Not on file    Attends meetings of clubs or organizations: Not on file    Relationship status: Not on file  . Intimate partner violence    Fear of current or ex partner: Not on file    Emotionally abused: Not on file    Physically abused: Not on file    Forced sexual activity: Not on file  Other Topics Concern  . Not on file  Social History Narrative  . Not on file   History reviewed. No pertinent family history.  OBJECTIVE:  Vitals:   06/12/19 1111  BP: 105/64  Pulse: 91  Resp: 18  Temp: 97.6 F (36.4 C)  SpO2: 97%    General appearance: alert; no distress Eyes: PERRLA; EOMI HENT: normocephalic; atraumatic; mildly TTP over left temporal and occipital lobe Neck: supple with FROM; no carotid bruits Lungs: clear to auscultation bilaterally Heart: regular rate and rhythm.   Extremities: no edema; symmetrical with no gross deformities Skin: warm and dry Neurologic: CN 2-12 intact; finger to nose without difficulty; normal gait; strength and sensation intact bilaterally about the upper and lower extremities; negative pronator drift Psychological: alert and cooperative; normal mood and affect; very pleasant    ASSESSMENT & PLAN:  1. Migraine without aura and without status migrainosus, not intractable     Meds ordered this encounter  Medications  .  dexamethasone (DECADRON) injection 10 mg  . ketorolac (TORADOL) 30 MG/ML injection 30 mg  . ondansetron (ZOFRAN-ODT) disintegrating tablet 4 mg   Declines further work-up in the ED for new migraines.  Will try migraine cocktail and close follow up with PCP next week.   Migraine cocktail given in office Rest and drink plenty of fluids Continue with OTC advil as needed Follow up with PCP next week for recheck and for further evaluation and management of migraines Return, go to the ER, or call 911 if you have any new or worsening symptoms such as fever, chills, nausea, vomiting, chest pain, shortness of breath, cough, vision changes, worsening headache despite treatment, slurred speech, facial asymmetry, weakness in arms or legs, etc...  Reviewed expectations re: course of current medical issues. Questions answered. Outlined signs and symptoms indicating need for more acute intervention. Patient verbalized understanding. After Visit Summary given.   Lestine Box, PA-C 06/12/19 1212

## 2019-06-16 ENCOUNTER — Other Ambulatory Visit: Payer: Self-pay | Admitting: Internal Medicine

## 2019-06-16 DIAGNOSIS — R519 Headache, unspecified: Secondary | ICD-10-CM

## 2019-06-17 ENCOUNTER — Other Ambulatory Visit: Payer: Medicare Other

## 2019-06-17 ENCOUNTER — Ambulatory Visit (HOSPITAL_COMMUNITY): Payer: Medicare Other

## 2019-06-17 ENCOUNTER — Ambulatory Visit
Admission: RE | Admit: 2019-06-17 | Discharge: 2019-06-17 | Disposition: A | Discharge status: Home / Self Care | Payer: Medicare Other | Source: Ambulatory Visit | Attending: Internal Medicine | Admitting: Internal Medicine

## 2019-06-17 DIAGNOSIS — R519 Headache, unspecified: Secondary | ICD-10-CM

## 2019-06-17 DIAGNOSIS — G43909 Migraine, unspecified, not intractable, without status migrainosus: Secondary | ICD-10-CM | POA: Diagnosis not present

## 2019-06-22 DIAGNOSIS — Z20828 Contact with and (suspected) exposure to other viral communicable diseases: Secondary | ICD-10-CM | POA: Diagnosis not present

## 2019-06-22 DIAGNOSIS — R519 Headache, unspecified: Secondary | ICD-10-CM | POA: Diagnosis not present

## 2019-06-23 ENCOUNTER — Other Ambulatory Visit: Payer: Medicare Other

## 2019-06-24 ENCOUNTER — Other Ambulatory Visit: Payer: Medicare Other

## 2019-06-30 DIAGNOSIS — Z23 Encounter for immunization: Secondary | ICD-10-CM | POA: Diagnosis not present

## 2019-08-11 ENCOUNTER — Other Ambulatory Visit: Payer: Self-pay

## 2019-08-11 ENCOUNTER — Ambulatory Visit (INDEPENDENT_AMBULATORY_CARE_PROVIDER_SITE_OTHER): Payer: Medicare Other | Admitting: Neurology

## 2019-08-11 ENCOUNTER — Encounter: Payer: Self-pay | Admitting: Neurology

## 2019-08-11 VITALS — BP 120/68 | HR 75 | Ht <= 58 in | Wt 97.0 lb

## 2019-08-11 DIAGNOSIS — G441 Vascular headache, not elsewhere classified: Secondary | ICD-10-CM | POA: Diagnosis not present

## 2019-08-11 DIAGNOSIS — F439 Reaction to severe stress, unspecified: Secondary | ICD-10-CM | POA: Diagnosis not present

## 2019-08-11 DIAGNOSIS — G44209 Tension-type headache, unspecified, not intractable: Secondary | ICD-10-CM | POA: Diagnosis not present

## 2019-08-11 DIAGNOSIS — R519 Headache, unspecified: Secondary | ICD-10-CM | POA: Diagnosis not present

## 2019-08-11 DIAGNOSIS — Z789 Other specified health status: Secondary | ICD-10-CM | POA: Diagnosis not present

## 2019-08-11 DIAGNOSIS — R9082 White matter disease, unspecified: Secondary | ICD-10-CM

## 2019-08-11 MED ORDER — CYCLOBENZAPRINE HCL 10 MG PO TABS: 10.0000 mg | ORAL_TABLET | Freq: Every evening | ORAL | 3 refills | Status: DC | PRN

## 2019-08-11 NOTE — Patient Instructions (Addendum)
I am glad to see that your neurological exam is normal, this is very reassuring and you CT.  Nevertheless, I would like to proceed with additional diagnostic testing including some blood work to look for inflammatory markers and autoimmune markers, your sedimentation rate was mildly elevated last time it was checked at the urgent care.  I think your headaches are more likely to be tension headaches and may be caffeine overuse and suboptimal hydration may play a role as well.  Your headaches are not very migrainous sounding.  I would like for you to limit your caffeine intake to up to 3 cups of coffee per day and try to increase your water intake to about 3 16 ounce bottles per day. I would like to order a brain MRI with and without contrast as well as a MR angiogram of the head to look at the blood vessels.We will call you with your blood test result as well as scan results, follow-up in 3 months, try a muscle relaxer called Flexeril, 10 mg strength, use as needed at night only, it can be sedating, please be mindful.I would not recommend you use Fioricet. We may consider a sleep study down the road.  You have woken up with a headache in the middle of the night, sometimes underlying sleep apnea can cause nighttime headaches and morning headaches.

## 2019-08-11 NOTE — Progress Notes (Signed)
Subjective:    Patient ID: Regina Moody is a 78 y.o. female.  HPI     Star Age, MD, PhD [facility] [address], [address] [place], [place] [address]  Dear Dr. Deforest Hoyles and Nicki Reaper, I saw your patient, Regina Moody, upon your kind request in my neurologic clinic today for initial consultation of her recurrent headaches.  The patient is unaccompanied today.  As you know, Regina Moody is a 78 year old right-handed woman with an underlying medical history of hyperlipidemia, allergic rhinitis, osteoporosis and chronic cystitis, who reports recurrent headaches for the past few weeks, probably about 2 months now, started in late September.  She reports having a bandlike sensation across the head, sometimes around the head, sometimes in the back of the head, sometimes in the bitemporal areas.  It is constant and achy.  She reports a prior history of migraine headaches when she was in her 40s, she was told that by age 68 or 89 she would not have any more headaches, she had seen a neurologist and also had testing and treatment at the time, she eventually did not have any more migraines.  She currently does not have any associated photophobia, noise sensitivity, nausea or vomiting.  She denies any one-sided weakness or numbness or tingling or droopy face or slurring of speech, she has no visual disturbance, in particular, no loss of vision, no blurry vision, and no visual aura.  She had cataract surgeries in February and March of this year and did well, she uses over-the-counter readers now. I reviewed her primary care office note from 06/16/2019.  She reported recurrent left-sided headaches for the previous 3 weeks.  She tried Fioricet but it made her sleepy.  She likes to drink coffee, she drinks about 6 or 7 cups of coffee per day, estimates that she drinks up to 2 bottles of water per day, 16 ounce each.  She does not drink any alcohol and is a non-smoker.  She is  widowed and lives by herself.  Her husband passed away 7 years ago.  She is retired, she lost her only son to cancer when he was 40.  She has 2 grand children who are now in their 13s.  [place], she has a larger home and large property and maintains the property herself, she mows the lawn herself.  She does endorse stress and feeling overwhelmed and she has been thinking about downsizing and moving to a smaller place, selling her property and may be even having an auction.  She had a head CT without contrast on 06/17/2019 and I reviewed the results: IMPRESSION: Chronic small vessel disease throughout the deep white matter. No acute intracranial abnormality. I reviewed your encounter note from 06/22/2019.  She went to urgent care.  She had blood work at the time including CBC with differential, platelet count was borderline high at 409, ESR was 80, CMP showed potassium borderline elevated at 5.4, BUN 18, creatinine 0.87.  A brain MRI and MRA were ordered but as I understand were not approved by her insurance.   She does not report any snoring or gasping sensation at night but has woken up with a headache in the middle of the night.  She has never had a sleep study.  Her Past Medical History Is Significant For: Past Medical History:  Diagnosis Date  . Osteoporosis     Her Past Surgical History Is Significant For: Past Surgical History:  Procedure Laterality Date  . ABDOMINAL HYSTERECTOMY    . CESAREAN SECTION      Her Family History Is Significant For: No family history on file.  Her Social History Is Significant For: Social History   Socioeconomic History  . Marital status: Married    Spouse name: Not on file  . Number of children: Not on file  . Years of education: Not on file  . Highest education level: Not on file  Occupational History  . Not on file  Social Needs  . Financial resource strain: Not on file  . Food insecurity    Worry: Not on file     Inability: Not on file  . Transportation needs    Medical: Not on file    Non-medical: Not on file  Tobacco Use  . Smoking status: Never Smoker  . Smokeless tobacco: Never Used  Substance and Sexual Activity  . Alcohol use: No  . Drug use: Not on file  . Sexual activity: Not on file  Lifestyle  . Physical activity    Days per week: Not on file    Minutes per session: Not on file  . Stress: Not on file  Relationships  . Social Herbalist on phone: Not on file    Gets together: Not on file    Attends religious service: Not on file    Active member of club or organization: Not on file    Attends meetings of clubs or organizations: Not on file    Relationship status: Not on file  Other Topics Concern  . Not on file  Social History Narrative  . Not on file    Her Allergies Are:  No Known Allergies:   Her Current Medications Are:  Outpatient Encounter Medications as of 08/11/2019  Medication Sig  . butalbital-acetaminophen-caffeine (FIORICET) 50-325-40 MG tablet Take by mouth 3 (three) times daily as needed for headache.  . ibuprofen (ADVIL) 200 MG tablet Take 200 mg by mouth every 6 (six) hours as needed for mild pain.  . Nitrofurantoin Macrocrystal (MACRODANTIN PO) Take by mouth.  . cyclobenzaprine (FLEXERIL) 10 MG tablet Take 1 tablet (10 mg total) by mouth at bedtime as needed for muscle spasms.  . [DISCONTINUED] alendronate (FOSAMAX) 70 MG tablet Take 70 mg by mouth once a week. Take with a full glass of water on an empty stomach.   No facility-administered encounter medications on file as of 08/11/2019.   :   Review of Systems:  Out of a complete 14 point review of systems, all are reviewed and negative with the exception of these symptoms as listed below:  Review of Systems  Neurological:       Pt presents today to discuss her daily headaches. Pt denies associated light and sound sensitivity and N/V. 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 Fioricet helps some but makes  her sleepy.    Objective:  Neurological Exam  Physical Exam Physical Examination:   Vitals:   08/11/19 1005  BP: 120/68  Pulse: 75    General Examination: The patient is a very pleasant 78 y.o. female in no acute distress. She appears well-developed and well-nourished and well groomed.   HEENT: Normocephalic, atraumatic, pupils are equal, round and reactive to light and accommodation. Funduscopic exam is normal with sharp disc margins noted. She is status post bilateral cataract repairs. Extraocular tracking is good without limitation to gaze excursion or nystagmus noted. Normal smooth pursuit is noted. Hearing is grossly intact. She has  no tenderness to palpation in the temporal areas, no palpable cord. Face is symmetric with normal facial animation and normal facial sensation. Speech is clear with no dysarthria noted. There is no hypophonia. There is no lip, neck/head, jaw or voice tremor. Neck is supple with full range of passive and active motion. There are no carotid bruits on auscultation. Oropharynx exam reveals: moderate mouth dryness, adequate dental hygiene and mild airway crowding, tongue protrudes centrally and palate elevates symmetrically.   Chest: Clear to auscultation without wheezing, rhonchi or crackles noted.  Heart: S1+S2+0, regular and normal without murmurs, rubs or gallops noted.   Abdomen: Soft, non-tender and non-distended with normal bowel sounds appreciated on auscultation.  Extremities: There is no pitting edema in the distal lower extremities bilaterally. Pedal pulses are intact.  Skin: Warm and dry without trophic changes noted.  Musculoskeletal: exam reveals arthritic changes in both hands.   Neurologically:  Mental status: The patient is awake, alert and oriented in all 4 spheres. Her immediate and remote memory, attention, language skills and fund of knowledge are appropriate. There is no evidence of aphasia, agnosia, apraxia or anomia. Speech is clear  with normal prosody and enunciation. Thought process is linear. Mood is normal and affect is normal.  Cranial nerves II - XII are as described above under HEENT exam. In addition: shoulder shrug is normal with equal shoulder height noted. Motor exam: Normal bulk, strength and tone is noted. There is no drift, tremor or rebound. Romberg is negative. Reflexes are 1+ throughout. Babinski: Toes are flexor bilaterally. Fine motor skills and coordination: intact with normal finger taps, normal hand movements, normal rapid alternating patting, normal foot taps and normal foot agility.  Cerebellar testing: No dysmetria or intention tremor on finger to nose testing. Heel to shin is unremarkable bilaterally. There is no truncal or gait ataxia.  Sensory exam: intact to light touch, vibration, temperature sense in the upper and lower extremities.  Gait, station and balance: She stands with no difficulty. No veering to one side is noted. No leaning to one side is noted. Posture is age-appropriate and stance is narrow based. Gait shows normal stride length and normal pace. No problems turning are noted. Tandem walk is unremarkable.  Assessment and Plan:   In summary, BRITA JURGENSEN is a very pleasant 78 y.o.-year old female with an underlying medical history of hyperlipidemia, allergic rhinitis, osteoporosis and chronic cystitis, who presents for evaluation of her recurrent headaches.  She describes a dull and constant type of pressure-like sensation around the crown of her head, in the back of her head and sometimes in the bitemporal area.  She has no associated neurological accompaniments, in particular,No one-sided weakness or numbness or tingling or droopy face or slurring of speech.  Her exam is nonfocal thankfully.  While she has a history of migraines in the past, her history is not suggestive of migrainous headaches, she has no associated photophobia, sonophobia, or nausea or vomiting.  She does report some  stressors recently.  She is wondering just overwhelmed.  She has been maintaining her large home and property by herself.  She is an only child, she has a lot of things from her parents and herself and from her son and storage at her property.  She mows the lawn by herself.  She lost her husband 7 years ago and lost her only son to cancer when he was 22 years old.  She is hoping to sell her house and property in [place] that are [place] her property and downsize to a smaller home eventually.She is advised that stress is a common cause for increase in headache frequency.  Her headaches are more tension type headaches, probably exacerbated by suboptimal hydration on a day-to-day basis, sleep disturbance, also fairly excessive caffeine use on a day-to-day basis.  She is encouraged to slowly curb her coffee intake and limit herself to up to 3 cups of coffee per day and increase her water intake to about 3 bottles of water per day.  She has woken up with a headache, we will consider a sleep study to rule out obstructive sleep apnea although she is slender and is not sure that she snores.  Nevertheless, sleep apnea can cause nocturnal and morning headaches.The ESR was elevated recently, her history and exam are not suggestive of temporal Arthritis, nevertheless, I would like to do additional blood work today including repeating ESR and also inflammatory and autoimmune markers.  We will call her with the result.  She had a recent benign head CT without contrast, I would like to do a more thorough imaging evaluation with a brain MRI with and without contrast as well as MRA head.Again, we will keep her posted as to her test results.  I would like for her to try Flexeril as needed at night only, she is reminded to be cautious with it as it is sedating.  She has noticed some sedation from the [place].  Given the caffeine Content of Fioricet and the fact that she felt sleepy from it she is discouraged from using Fioricet at  this time. She was given written instructions and a prescription for Flexeril.  We will keep her posted as to her blood test result and imaging results by phone call, she is advised to follow-up in 3 months, sooner if needed. She is reassured that her Recent head CT did not show any acute findings and that her neurological exam is nonfocal today.  I answered all her questions today and she was in agreement with the above plan.  Thank you very much for allowing me to participate in the care of this nice patient. If I can be of any further assistance to you please do not hesitate to call me at [phone].  Sincerely,   Star Age, MD, PhD

## 2019-08-12 ENCOUNTER — Telehealth: Payer: Self-pay

## 2019-08-12 LAB — COMPREHENSIVE METABOLIC PANEL
ALT: 11 IU/L (ref 0–32)
AST: 19 IU/L (ref 0–40)
Albumin/Globulin Ratio: 1.3 (ref 1.2–2.2)
Albumin: 4 g/dL (ref 3.7–4.7)
Alkaline Phosphatase: 116 IU/L (ref 39–117)
BUN/Creatinine Ratio: 18 (ref 12–28)
BUN: 13 mg/dL (ref 8–27)
Bilirubin Total: 0.3 mg/dL (ref 0.0–1.2)
CO2: 23 mmol/L (ref 20–29)
Calcium: 9.7 mg/dL (ref 8.7–10.3)
Chloride: 103 mmol/L (ref 96–106)
Creatinine, Ser: 0.73 mg/dL (ref 0.57–1.00)
GFR calc Af Amer: 91 mL/min/{1.73_m2} (ref >59)
GFR calc non Af Amer: 79 mL/min/{1.73_m2} (ref >59)
Globulin, Total: 3 g/dL (ref 1.5–4.5)
Glucose: 71 mg/dL (ref 65–99)
Potassium: 4.5 mmol/L (ref 3.5–5.2)
Sodium: 141 mmol/L (ref 134–144)
Total Protein: 7 g/dL (ref 6.0–8.5)

## 2019-08-12 LAB — ANA W/REFLEX: Anti Nuclear Antibody (ANA): NEGATIVE

## 2019-08-12 LAB — C-REACTIVE PROTEIN: CRP: 7 mg/L (ref 0–10)

## 2019-08-12 LAB — SEDIMENTATION RATE: Sed Rate: 21 mm/hr (ref 0–40)

## 2019-08-12 LAB — RHEUMATOID FACTOR: Rhuematoid fact SerPl-aCnc: 13.2 IU/mL (ref 0.0–13.9)

## 2019-08-12 LAB — RPR: RPR Ser Ql: NONREACTIVE

## 2019-08-12 NOTE — Telephone Encounter (Signed)
I called pt, got the busy signal, will try again later.

## 2019-08-12 NOTE — Progress Notes (Signed)
Blood test results were unremarkable including kidney and liver function, autoimmune marker, inflammatory markers. Please update patient.

## 2019-08-12 NOTE — Telephone Encounter (Signed)
I called pt and discussed this with her. Pt verbalized understanding of results.

## 2019-08-12 NOTE — Telephone Encounter (Signed)
-----   Message from Star Age, MD sent at 08/12/2019  1:47 PM EST ----- Blood test results were unremarkable including kidney and liver function, autoimmune marker, inflammatory markers. Please update patient.

## 2019-09-02 ENCOUNTER — Other Ambulatory Visit: Payer: Self-pay | Admitting: Neurology

## 2019-09-07 ENCOUNTER — Other Ambulatory Visit: Payer: Self-pay

## 2019-09-07 ENCOUNTER — Ambulatory Visit
Admission: RE | Admit: 2019-09-07 | Discharge: 2019-09-07 | Disposition: A | Discharge status: Home / Self Care | Payer: Medicare Other | Source: Ambulatory Visit | Attending: Neurology | Admitting: Neurology

## 2019-09-07 DIAGNOSIS — G441 Vascular headache, not elsewhere classified: Secondary | ICD-10-CM | POA: Diagnosis not present

## 2019-09-07 DIAGNOSIS — F439 Reaction to severe stress, unspecified: Secondary | ICD-10-CM

## 2019-09-07 DIAGNOSIS — R9082 White matter disease, unspecified: Secondary | ICD-10-CM

## 2019-09-07 DIAGNOSIS — Z789 Other specified health status: Secondary | ICD-10-CM

## 2019-09-07 DIAGNOSIS — R519 Headache, unspecified: Secondary | ICD-10-CM

## 2019-09-07 DIAGNOSIS — G44209 Tension-type headache, unspecified, not intractable: Secondary | ICD-10-CM

## 2019-09-07 DIAGNOSIS — Z23 Encounter for immunization: Secondary | ICD-10-CM | POA: Diagnosis not present

## 2019-09-07 MED ORDER — GADOBENATE DIMEGLUMINE 529 MG/ML IV SOLN
8.0000 mL | Freq: Once | INTRAVENOUS | Status: AC | PRN
  Administered 2019-09-07: 8 mL via INTRAVENOUS

## 2019-09-10 NOTE — Progress Notes (Signed)
Please call patient regarding the recent brain MRI: The brain scan showed a normal structure of the brain and no significant volume loss which we call atrophy. There were changes in the deeper structures of the brain, which we call white matter changes or microvascular changes. These were reported as moderate in Her case. These are tiny white spots, that occur with time and are seen in a variety of conditions, including with normal aging, chronic hypertension, chronic headaches, especially migraine HAs, chronic diabetes, chronic hyperlipidemia. These are not strokes and no mass or lesion or contrast enhancement was seen which is reassuring. Again, there were no acute findings, such as a stroke, or mass or blood products.  No obvious cause for her headaches.  MRA, which is the MR study to look at her brain blood vessels, showed no significant abnormalities.  No further action is required on this test at this time, other than re-enforcing the importance of good blood pressure control, good cholesterol control, good blood sugar control, and weight management. Please remind patient to keep any upcoming appointments or tests and to call us with any interim questions, concerns, problems or updates. Thanks,  Star Age, MD, PhD

## 2019-09-15 ENCOUNTER — Telehealth: Payer: Self-pay

## 2019-09-15 NOTE — Telephone Encounter (Signed)
Left vm for patient to call back about MR angio head and MR brain results.

## 2019-09-15 NOTE — Telephone Encounter (Signed)
-----   Message from Star Age, MD sent at 09/10/2019  7:40 AM EST ----- Please call patient regarding the recent brain MRI: The brain scan showed a normal structure of the brain and no significant volume loss which we call atrophy. There were changes in the deeper structures of the brain, which we call white matter changes or microvascular changes. These were reported as moderate in Her case. These are tiny white spots, that occur with time and are seen in a variety of conditions, including with normal aging, chronic hypertension, chronic headaches, especially migraine HAs, chronic diabetes, chronic hyperlipidemia. These are not strokes and no mass or lesion or contrast enhancement was seen which is reassuring. Again, there were no acute findings, such as a stroke, or mass or blood products.  No obvious cause for her headaches.  MRA, which is the MR study to look at her brain blood vessels, showed no significant abnormalities.  No further action is required on this test at this time, other than re-enforcing the importance of good blood pressure control, good cholesterol control, good blood sugar control, and weight management. Please remind patient to keep any upcoming appointments or tests and to call us with any interim questions, concerns, problems or updates. Thanks,  Star Age, MD, PhD

## 2019-09-15 NOTE — Telephone Encounter (Signed)
I called pt about her MR angio head and MR brain results. I gave her Dr.Athar results listed below. Pt verbalized understanding.  The brain scan showed a normal structure of the brain and no significant volume loss which we call atrophy. There were changes in the deeper structures of the brain, which we call white matter changes or microvascular changes. These were reported as moderate in Her case. These are tiny white spots, that occur with time and are seen in a variety of conditions, including with normal aging, chronic hypertension, chronic headaches, especially migraine HAs, chronic diabetes, chronic hyperlipidemia. These are not strokes and no mass or lesion or contrast enhancement was seen which is reassuring. Again, there were no acute findings, such as a stroke, or mass or blood products.  No obvious cause for her headaches.  MRA, which is the MR study to look at her brain blood vessels, showed no significant abnormalities.  No further action is required on this test at this time, other than re-enforcing the importance of good blood pressure control, good cholesterol control, good blood sugar control, and weight management.I reminded patient to keep any upcoming appointments or tests and to call us with any interim questions, concerns, problems or updates.

## 2019-10-05 DIAGNOSIS — Z23 Encounter for immunization: Secondary | ICD-10-CM | POA: Diagnosis not present

## 2019-11-09 ENCOUNTER — Ambulatory Visit (INDEPENDENT_AMBULATORY_CARE_PROVIDER_SITE_OTHER): Payer: Medicare Other | Admitting: Neurology

## 2019-11-09 ENCOUNTER — Encounter: Payer: Self-pay | Admitting: Neurology

## 2019-11-09 ENCOUNTER — Other Ambulatory Visit: Payer: Self-pay

## 2019-11-09 VITALS — BP 125/67 | HR 78 | Temp 97.3°F | Ht 59.0 in | Wt 98.3 lb

## 2019-11-09 DIAGNOSIS — G44209 Tension-type headache, unspecified, not intractable: Secondary | ICD-10-CM | POA: Diagnosis not present

## 2019-11-09 NOTE — Patient Instructions (Signed)
I am so glad to hear that your headaches are better.  Please continue to take care of yourself.  Your brain scan and MRA which is the brain blood vessels scan were without any acute findings thankfully.  Your exam is stable as well.  You can continue to use the Flexeril if needed as previously prescribed.  Please try to hydrate a little better with water, try to get 6 cups a day in, 8 ounce each.  We can see you in follow-up in 6 months, you can see one of our nurse practitioners.

## 2019-11-09 NOTE — Progress Notes (Signed)
Subjective:    Patient ID: Regina Moody is a 79 y.o. female.  HPI     Interim history:   Regina Moody is a 79 year old right-handed woman with an underlying medical history of hyperlipidemia, allergic rhinitis, osteoporosis and chronic cystitis, who presents for follow-up consultation of her recurrent headaches.  The patient is unaccompanied today.  I first met her at the request of her primary care physician on 08/11/2019, at which time she reported recurrent headaches for a few weeks, about 2 months.  Prior history was not telltale for migraines.  She had likely tension type headaches.  I suggested a cautious trial of Flexeril at night as needed.  I also cautioned her regarding the use of Fioricet for headaches.  I did ask her to have some blood work and also proceed with a brain MRI as well as MRA head.  Blood work including CMP, RPR, ESR, rheumatoid factor, CRP, ANA was unremarkable.  She had a brain MRI with and without contrast as well as MRA head without contrast on 09/07/2019 and I reviewed the results:  IMPRESSION:    Unremarkable MRA head (without).  No significant stenosis or occlusion.   IMPRESSION:    MRI brain (with and without) demonstrating: - Moderate periventricular and subcortical chronic small vessel ischemic disease.  - No acute findings.    We called her with her test results.    Today, 11/09/2019: She reports feeling better.  She ended up taking the Flexeril only once.  She thought it helped but did not feel she had to take it again.  She has been exercising on a regular basis, tries to rest and off, feels that overall she is better.  She has not had any new symptoms and feels well.  She had both Covid vaccination doses.    The patient's allergies, current medications, family history, past medical history, past social history, past surgical history and problem list were reviewed and updated as appropriate.   Previously:   06/11/2019: (She) reports recurrent  headaches for the past few weeks, probably about 2 months now, started in late September.  She reports having a bandlike sensation across the head, sometimes around the head, sometimes in the back of the head, sometimes in the bitemporal areas.  It is constant and achy.  She reports a prior history of migraine headaches when she was in her 81s, she was told that by age 73 or 68 she would not have any more headaches, she had seen a neurologist and also had testing and treatment at the time, she eventually did not have any more migraines.  She currently does not have any associated photophobia, noise sensitivity, nausea or vomiting.  She denies any one-sided weakness or numbness or tingling or droopy face or slurring of speech, she has no visual disturbance, in particular, no loss of vision, no blurry vision, and no visual aura.  She had cataract surgeries in February and March of this year and did well, she uses over-the-counter readers now. I reviewed her primary care office note from 06/16/2019.  She reported recurrent left-sided headaches for the previous 3 weeks.  She tried Fioricet but it made her sleepy.  She likes to drink coffee, she drinks about 6 or 7 cups of coffee per day, estimates that she drinks up to 2 bottles of water per day, 16 ounce each.  She does not drink any alcohol and is a non-smoker.  She is widowed and lives by herself.  Her husband passed  away 7 years ago.  She is retired, she lost her only son to cancer when he was 104.  She has 2 grand children who are now in their 11s.  [place], she has a larger home and large property and maintains the property herself, she mows the lawn herself.  She does endorse stress and feeling overwhelmed and she has been thinking about downsizing and moving to a smaller place, selling her property and may be even having an auction.  She had a head CT without contrast on 06/17/2019 and I reviewed the results: IMPRESSION: Chronic  small vessel disease throughout the deep white matter. No acute intracranial abnormality. I reviewed your encounter note from 06/22/2019.  She went to urgent care.  She had blood work at the time including CBC with differential, platelet count was borderline high at 409, ESR was 80, CMP showed potassium borderline elevated at 5.4, BUN 18, creatinine 0.87.  A brain MRI and MRA were ordered but as I understand were not approved by her insurance.   She does not report any snoring or gasping sensation at night but has woken up with a headache in the middle of the night.  She has never had a sleep study.   Her Past Medical History Is Significant For: Past Medical History:  Diagnosis Date  . Osteoporosis     Her Past Surgical History Is Significant For: Past Surgical History:  Procedure Laterality Date  . ABDOMINAL HYSTERECTOMY    . CESAREAN SECTION      Her Family History Is Significant For: History reviewed. No pertinent family history.  Her Social History Is Significant For: Social History   Socioeconomic History  . Marital status: Married    Spouse name: Not on file  . Number of children: Not on file  . Years of education: Not on file  . Highest education level: Not on file  Occupational History  . Not on file  Tobacco Use  . Smoking status: Never Smoker  . Smokeless tobacco: Never Used  Substance and Sexual Activity  . Alcohol use: No  . Drug use: Not on file  . Sexual activity: Not on file  Other Topics Concern  . Not on file  Social History Narrative  . Not on file   Social Determinants of Health   Financial Resource Strain:   . Difficulty of Paying Living Expenses: Not on file  Food Insecurity:   . Worried About Charity fundraiser in the Last Year: Not on file  . Ran Out of Food in the Last Year: Not on file  Transportation Needs:   . Lack of Transportation (Medical): Not on file  . Lack of Transportation (Non-Medical): Not on file  Physical Activity:   .  Days of Exercise per Week: Not on file  . Minutes of Exercise per Session: Not on file  Stress:   . Feeling of Stress : Not on file  Social Connections:   . Frequency of Communication with Friends and Family: Not on file  . Frequency of Social Gatherings with Friends and Family: Not on file  . Attends Religious Services: Not on file  . Active Member of Clubs or Organizations: Not on file  . Attends Archivist Meetings: Not on file  . Marital Status: Not on file    Her Allergies Are:  No Known Allergies:   Her Current Medications Are:  Outpatient Encounter Medications as of 11/09/2019  Medication Sig  .  butalbital-acetaminophen-caffeine (FIORICET) 50-325-40 MG tablet Take by mouth 3 (three) times daily as needed for headache.  . cyclobenzaprine (FLEXERIL) 10 MG tablet Take 1 tablet (10 mg total) by mouth at bedtime as needed for muscle spasms.  Marland Kitchen ibuprofen (ADVIL) 200 MG tablet Take 200 mg by mouth every 6 (six) hours as needed for mild pain.  . Nitrofurantoin Macrocrystal (MACRODANTIN PO) Take by mouth.   No facility-administered encounter medications on file as of 11/09/2019.  :  Review of Systems:  Out of a complete 14 point review of systems, all are reviewed and negative with the exception of these symptoms as listed below:  Review of Systems  Neurological:       Pt reports she has been doing well since her last visit.  Reports a few h/a but over all been well treated.    Objective:  Neurological Exam  Physical Exam Physical Examination:   Vitals:   11/09/19 1030  BP: 125/67  Pulse: 78  Temp: (!) 97.3 F (36.3 C)    General Examination: The patient is a very pleasant 79 y.o. female in no acute distress. She appears well-developed and well-nourished and well groomed.   HEENT: Normocephalic, atraumatic, pupils are equal, round and reactive to light. She is status post bilateral cataract repairs. Extraocular tracking is good without limitation to gaze  excursion or nystagmus noted. Normal smooth pursuit is noted. Hearing is grossly intact. She has no tenderness to palpation in the temporal areas, no palpable cord. Face is symmetric with normal facial animation and normal facial sensation. Speech is clear with no dysarthria noted. There is no hypophonia. There is no lip, neck/head, jaw or voice tremor. Neck is supple with full range of passive and active motion. There are no carotid bruits on auscultation. Oropharynx exam reveals: mild to moderate mouth dryness, adequate dental hygiene and mild airway crowding, tongue protrudes centrally and palate elevates symmetrically.   Chest: Clear to auscultation without wheezing, rhonchi or crackles noted.  Heart: S1+S2+0, regular and normal without murmurs, rubs or gallops noted.   Abdomen: Soft, non-tender and non-distended with normal bowel sounds appreciated on auscultation.  Extremities: There is no pitting edema in the distal lower extremities bilaterally. Pedal pulses are intact.  Skin: Warm and dry without trophic changes noted.  Musculoskeletal: exam reveals arthritic changes in both hands.   Neurologically:  Mental status: The patient is awake, alert and oriented in all 4 spheres. Her immediate and remote memory, attention, language skills and fund of knowledge are appropriate. There is no evidence of aphasia, agnosia, apraxia or anomia. Speech is clear with normal prosody and enunciation. Thought process is linear. Mood is normal and affect is normal.  Cranial nerves II - XII are as described above under HEENT exam. In addition: shoulder shrug is normal with equal shoulder height noted. Motor exam: Normal bulk, strength and tone is noted. There is no drift, tremor or rebound. Reflexes are 1+ throughout. Fine motor skills and coordination: intact with normal finger taps, normal hand movements, normal rapid alternating patting, normal foot taps and normal foot agility.  Cerebellar testing:  No dysmetria or intention tremor. There is no truncal or gait ataxia.  Sensory exam: intact to light touch.   Gait, station and balance: She stands with no difficulty. No veering to one side is noted. No leaning to one side is noted. Posture is age-appropriate and stance is narrow based. Gait shows normal stride length and normal pace. No problems turning are noted.   Assessment  and Plan:   In summary, Regina Moody is a very pleasant 79 year old female with an underlying medical history of hyperlipidemia, allergic rhinitis, osteoporosis and chronic cystitis, who presents for Follow-up consultation of her recurrent headaches.  She feels improved.  Her history is not suggestive of migraine headaches, she likely had tension headaches.  She did endorse stressors when we first met.  She is motivated to continue with a healthy lifestyle, is encouraged to drink a little bit more water on a day-to-day basis as she probably averages 3 to 4 cups of water per day.  She does like to drink coffee, up to 3 cups in the morning. Work-up in the form of blood work, MRI brain, MRA head were nonrevealing, no acute findings were noted.  Her exam is stable.  She feels that exercising on a regular basis has helped as well.  She had tried Flexeril once, I gave her a prescription to cautiously try Flexeril at night as needed only.  She still has The prescription and can use it as needed.  She is advised to follow-up routinely to see one of our nurse practitioners in 6 months.  She is encouraged to try to drink about 6 cups of water per day, 8 ounce each.  I answered all her questions today and she was in agreement with the plan. I spent 20 minutes in total face-to-face time and in reviewing records during pre-charting, more than 50% of which was spent in counseling and coordination of care, reviewing test results, reviewing medications and treatment regimen and/or in discussing or reviewing the diagnosis of tension HAs, the  prognosis and treatment options. Pertinent laboratory and imaging test results that were available during this visit with the patient were reviewed by me and considered in my medical decision making (see chart for details).

## 2020-02-16 DIAGNOSIS — Z1389 Encounter for screening for other disorder: Secondary | ICD-10-CM | POA: Diagnosis not present

## 2020-02-16 DIAGNOSIS — E7800 Pure hypercholesterolemia, unspecified: Secondary | ICD-10-CM | POA: Diagnosis not present

## 2020-02-16 DIAGNOSIS — Z0000 Encounter for general adult medical examination without abnormal findings: Secondary | ICD-10-CM | POA: Diagnosis not present

## 2020-02-16 DIAGNOSIS — G43909 Migraine, unspecified, not intractable, without status migrainosus: Secondary | ICD-10-CM | POA: Diagnosis not present

## 2020-02-16 DIAGNOSIS — J309 Allergic rhinitis, unspecified: Secondary | ICD-10-CM | POA: Diagnosis not present

## 2020-02-16 DIAGNOSIS — M810 Age-related osteoporosis without current pathological fracture: Secondary | ICD-10-CM | POA: Diagnosis not present

## 2020-03-28 ENCOUNTER — Other Ambulatory Visit: Payer: Self-pay | Admitting: Internal Medicine

## 2020-03-28 DIAGNOSIS — I739 Peripheral vascular disease, unspecified: Secondary | ICD-10-CM

## 2020-03-28 DIAGNOSIS — M79669 Pain in unspecified lower leg: Secondary | ICD-10-CM | POA: Diagnosis not present

## 2020-03-28 DIAGNOSIS — M810 Age-related osteoporosis without current pathological fracture: Secondary | ICD-10-CM | POA: Diagnosis not present

## 2020-04-01 ENCOUNTER — Ambulatory Visit
Admission: RE | Admit: 2020-04-01 | Discharge: 2020-04-01 | Disposition: A | Discharge status: Home / Self Care | Payer: Medicare Other | Source: Ambulatory Visit | Attending: Internal Medicine | Admitting: Internal Medicine

## 2020-04-01 DIAGNOSIS — I739 Peripheral vascular disease, unspecified: Secondary | ICD-10-CM

## 2020-04-01 DIAGNOSIS — I70211 Atherosclerosis of native arteries of extremities with intermittent claudication, right leg: Secondary | ICD-10-CM | POA: Diagnosis not present

## 2020-04-06 ENCOUNTER — Encounter: Payer: Self-pay | Admitting: Vascular Surgery

## 2020-04-06 ENCOUNTER — Other Ambulatory Visit: Payer: Self-pay

## 2020-04-06 ENCOUNTER — Ambulatory Visit (INDEPENDENT_AMBULATORY_CARE_PROVIDER_SITE_OTHER): Payer: Medicare Other | Admitting: Vascular Surgery

## 2020-04-06 VITALS — BP 119/74 | HR 76 | Temp 97.6°F | Resp 20 | Ht 59.0 in | Wt 97.9 lb

## 2020-04-06 DIAGNOSIS — I739 Peripheral vascular disease, unspecified: Secondary | ICD-10-CM | POA: Diagnosis not present

## 2020-04-06 NOTE — Progress Notes (Signed)
REASON FOR CONSULT:    Peripheral vascular disease.  The consult is requested by Dr. Lysle Rubens.  ASSESSMENT & PLAN:   PERIPHERAL VASCULAR DISEASE: This patient has evidence of severe infrainguinal arterial occlusive disease on the left.  However she has stable claudication.  She denies any history of rest pain or nonhealing ulcers.  We have discussed the importance of a structured walking program.  We discussed how collateral circulation develops.  We also discussed the importance of nutrition.  I favor a plant-based diet.  Fortunately she is not a smoker.  I did feel that given the severity of her disease it would be reasonable to proceed with arteriography now to see if there is anything to do from an endovascular standpoint.  It would also be reasonable to follow this given that her symptoms at this point are simply claudication.  She feels that her symptoms are tolerable and therefore I have ordered follow-up ABIs in 6 months and I will see her back at that time.  She knows to call sooner if she has problems.  We have discussed arteriography and the potential complications.  Given her peripheral vascular disease I would consider a low-dose statin but will leave that to her primary care physician.   Deitra Mayo, MD Office: [phone]   HPI:   Regina Moody is a pleasant 79 y.o. female, who was sent for evaluation of peripheral vascular disease.  I have reviewed the records from the referring office.  The patient had evidence of peripheral vascular disease of the left leg on exam.  Patient had been seen on 03/28/2020 complaining of foot numbness.  She describes claudication and noninvasive studies suggested significant peripheral vascular disease.  She is sent for vascular consultation.  On my history the patient describes the gradual onset of left calf claudication 6 weeks ago.  The symptoms occurred a fairly short distance.  She denies any symptoms in the right leg.  There are no other  aggravating or alleviating factors.  She denies any history of rest pain or nonhealing ulcers.  She is very active for her age and likes to cut the grass.  She has no significant risk factors for peripheral vascular disease.  She denies any history of diabetes, hypertension, hypercholesterolemia, family history of premature cardiovascular disease, or tobacco use.  She is on aspirin but is not on a statin.  Past Medical History:  Diagnosis Date  . Frequent headaches   . Osteoporosis     History reviewed. No pertinent family history.  SOCIAL HISTORY: Social History   Socioeconomic History  . Marital status: Married    Spouse name: Not on file  . Number of children: Not on file  . Years of education: Not on file  . Highest education level: Not on file  Occupational History  . Not on file  Tobacco Use  . Smoking status: Never Smoker  . Smokeless tobacco: Never Used  Vaping Use  . Vaping Use: Never used  Substance and Sexual Activity  . Alcohol use: No  . Drug use: Never  . Sexual activity: Not on file  Other Topics Concern  . Not on file  Social History Narrative  . Not on file   Social Determinants of Health   Financial Resource Strain:   . Difficulty of Paying Living Expenses:   Food Insecurity:   . Worried About Charity fundraiser in the Last Year:   . [place] in the Last Year:  Transportation Needs:   . Film/video editor (Medical):   Marland Kitchen Lack of Transportation (Non-Medical):   Physical Activity:   . Days of Exercise per Week:   . Minutes of Exercise per Session:   Stress:   . Feeling of Stress :   Social Connections:   . Frequency of Communication with Friends and Family:   . Frequency of Social Gatherings with Friends and Family:   . Attends Religious Services:   . Active Member of Clubs or Organizations:   . Attends Archivist Meetings:   Marland Kitchen Marital Status:   Intimate Partner Violence:   . Fear of Current or Ex-Partner:   .  Emotionally Abused:   Marland Kitchen Physically Abused:   . Sexually Abused:     No Known Allergies  Current Outpatient Medications  Medication Sig Dispense Refill  . alendronate (FOSAMAX) 70 MG tablet Take 70 mg by mouth once a week.    . butalbital-acetaminophen-caffeine (FIORICET) 50-325-40 MG tablet Take by mouth 3 (three) times daily as needed for headache.    . cyclobenzaprine (FLEXERIL) 10 MG tablet Take 1 tablet (10 mg total) by mouth at bedtime as needed for muscle spasms. 30 tablet 3  . ibuprofen (ADVIL) 200 MG tablet Take 200 mg by mouth every 6 (six) hours as needed for mild pain.    . Nitrofurantoin Macrocrystal (MACRODANTIN PO) Take by mouth. Prn urinary symptoms     No current facility-administered medications for this visit.    REVIEW OF SYSTEMS:  [X]  denotes positive finding, [ ]  denotes negative finding Cardiac  Comments:  Chest pain or chest pressure:    Shortness of breath upon exertion:    Short of breath when lying flat:    Irregular heart rhythm:        Vascular    Pain in calf, thigh, or hip brought on by ambulation: x   Pain in feet at night that wakes you up from your sleep:     Blood clot in your veins:    Leg swelling:         Pulmonary    Oxygen at home:    Productive cough:     Wheezing:         Neurologic    Sudden weakness in arms or legs:     Sudden numbness in arms or legs:     Sudden onset of difficulty speaking or slurred speech:    Temporary loss of vision in one eye:     Problems with dizziness:         Gastrointestinal    Blood in stool:     Vomited blood:         Genitourinary    Burning when urinating:     Blood in urine:        Psychiatric    Major depression:         Hematologic    Bleeding problems:    Problems with blood clotting too easily:        Skin    Rashes or ulcers:        Constitutional    Fever or chills:     PHYSICAL EXAM:   Vitals:   04/06/20 0935  BP: 119/74  Pulse: 76  Resp: 20  Temp: 97.6 F (36.4  C)  SpO2: 99%  Weight: 44.4 kg  Height: 4\' 11"  (1.499 m)    GENERAL: The patient is a well-nourished female, in no acute distress. The vital signs are documented  above. CARDIAC: There is a regular rate and rhythm.  VASCULAR: I do not detect carotid bruits. She has palpable femoral pulses bilaterally. On the right side she has a palpable popliteal and dorsalis pedis pulse. I cannot palpate a popliteal pulse on the left or pedal pulses on the left. She has no significant lower extremity swelling. PULMONARY: There is good air exchange bilaterally without wheezing or rales. ABDOMEN: Soft and non-tender with normal pitched bowel sounds.  MUSCULOSKELETAL: There are no major deformities or cyanosis. NEUROLOGIC: No focal weakness or paresthesias are detected. SKIN: There are no ulcers or rashes noted. PSYCHIATRIC: The patient has a normal affect.  DATA:    ARTERIAL DOPPLER STUDY: I reviewed the arterial Doppler study from 04/01/2020.  On the right side the ABI was 100% with a toe pressure of 46 mmHg.  On the left side ABI was 39% with no toe pressure.  LABS: Reviewed her labs from 08/11/2019.  Her GFR was 79.  Creatinine was 0.73.

## 2020-04-07 ENCOUNTER — Other Ambulatory Visit: Payer: Self-pay | Admitting: *Deleted

## 2020-04-07 DIAGNOSIS — I739 Peripheral vascular disease, unspecified: Secondary | ICD-10-CM

## 2020-04-20 DIAGNOSIS — Z961 Presence of intraocular lens: Secondary | ICD-10-CM | POA: Diagnosis not present

## 2020-04-20 DIAGNOSIS — D3132 Benign neoplasm of left choroid: Secondary | ICD-10-CM | POA: Diagnosis not present

## 2020-04-20 DIAGNOSIS — H5203 Hypermetropia, bilateral: Secondary | ICD-10-CM | POA: Diagnosis not present

## 2020-05-13 ENCOUNTER — Other Ambulatory Visit: Payer: Self-pay | Admitting: Internal Medicine

## 2020-05-13 DIAGNOSIS — Z1231 Encounter for screening mammogram for malignant neoplasm of breast: Secondary | ICD-10-CM

## 2020-05-16 ENCOUNTER — Ambulatory Visit (INDEPENDENT_AMBULATORY_CARE_PROVIDER_SITE_OTHER): Payer: Medicare Other | Admitting: Family Medicine

## 2020-05-16 ENCOUNTER — Other Ambulatory Visit: Payer: Self-pay

## 2020-05-16 ENCOUNTER — Encounter: Payer: Self-pay | Admitting: Family Medicine

## 2020-05-16 VITALS — BP 106/48 | HR 75 | Ht 59.0 in | Wt 99.0 lb

## 2020-05-16 DIAGNOSIS — G44209 Tension-type headache, unspecified, not intractable: Secondary | ICD-10-CM | POA: Diagnosis not present
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
## 2020-05-16 NOTE — Patient Instructions (Signed)
We will continue to monitor headaches. I am so glad you are doing well.   You may use Flexeril as needed.   Follow up in 1 year, as needed thereafter. If headaches return call sooner.    Tension Headache, Adult A tension headache is pain, pressure, or aching in your head. Tension headaches can last from 30 minutes to several days. Follow these instructions at home: Managing pain  Take over-the-counter and prescription medicines only as told by your doctor.  When you have a headache, lie down in a dark, quiet room.  If told, put ice on your head and neck: ? Put ice in a plastic bag. ? Place a towel between your skin and the bag. ? Leave the ice on for 20 minutes, 2-3 times a day.  If told, put heat on the back of your neck. Do this as often as your doctor tells you to. Use the kind of heat that your doctor recommends, such as a moist heat pack or a heating pad. ? Place a towel between your skin and the heat. ? Leave the heat on for 20-30 minutes. ? Remove the heat if your skin turns bright red. Eating and drinking  Eat meals on a regular schedule.  Watch how much alcohol you drink: ? If you are a woman and are not pregnant, do not drink more than 1 drink a day. ? If you are a man, do not drink more than 2 drinks a day.  Drink enough fluid to keep your pee (urine) pale yellow.  Do not use a lot of caffeine, or stop using caffeine. Lifestyle  Get enough sleep. Get 7-9 hours of sleep each night. Or get the amount of sleep that your doctor tells you to.  At bedtime, remove all electronic devices from your room. Examples of electronic devices are computers, phones, and tablets.  Find ways to lessen your stress. Some things that can lessen stress are: ? Exercise. ? Deep breathing. ? Yoga. ? Music. ? Positive thoughts.  Sit up straight. Do not tighten (tense) your muscles.  Do not use any products that have nicotine or tobacco in them, such as cigarettes and e-cigarettes.  If you need help quitting, ask your doctor. General instructions   Keep all follow-up visits as told by your doctor. This is important.  Avoid things that can bring on headaches. Keep a journal to find out if certain things bring on headaches. For example, write down: ? What you eat and drink. ? How much sleep you get. ? Any change to your diet or medicines. Contact a doctor if:  Your headache does not get better.  Your headache comes back.  You have a headache and sounds, light, or smells bother you.  You feel sick to your stomach (nauseous) or you throw up (vomit).  Your stomach hurts. Get help right away if:  You suddenly get a very bad headache along with any of these: ? A stiff neck. ? Feeling sick to your stomach. ? Throwing up. ? Feeling weak. ? Trouble seeing. ? Feeling short of breath. ? A rash. ? Feeling unusually sleepy. ? Trouble speaking. ? Pain in your eye or ear. ? Trouble walking or balancing. ? Feeling like you will pass out (faint). ? Passing out. Summary  A tension headache is pain, pressure, or aching in your head.  Tension headaches can last from 30 minutes to several days.  Lifestyle changes and medicines may help relieve pain. This information is not  intended to replace advice given to you by your health care provider. Make sure you discuss any questions you have with your health care provider. Document Revised: 06/17/2019 Document Reviewed: 11/30/2016 Elsevier Patient Education  [place].

## 2020-06-03 ENCOUNTER — Ambulatory Visit
Admission: RE | Admit: 2020-06-03 | Discharge: 2020-06-03 | Disposition: A | Discharge status: Home / Self Care | Payer: Medicare Other | Source: Ambulatory Visit | Attending: Internal Medicine | Admitting: Internal Medicine

## 2020-06-03 ENCOUNTER — Other Ambulatory Visit: Payer: Self-pay

## 2020-06-03 DIAGNOSIS — Z1231 Encounter for screening mammogram for malignant neoplasm of breast: Secondary | ICD-10-CM | POA: Diagnosis not present

## 2020-06-07 DIAGNOSIS — D692 Other nonthrombocytopenic purpura: Secondary | ICD-10-CM | POA: Diagnosis not present

## 2020-06-07 DIAGNOSIS — D485 Neoplasm of uncertain behavior of skin: Secondary | ICD-10-CM | POA: Diagnosis not present

## 2020-06-07 DIAGNOSIS — L570 Actinic keratosis: Secondary | ICD-10-CM | POA: Diagnosis not present

## 2020-07-14 DIAGNOSIS — Z23 Encounter for immunization: Secondary | ICD-10-CM | POA: Diagnosis not present

## 2020-08-26 ENCOUNTER — Encounter: Payer: Self-pay | Admitting: Emergency Medicine

## 2020-08-26 ENCOUNTER — Ambulatory Visit
Admission: EM | Admit: 2020-08-26 | Discharge: 2020-08-26 | Disposition: A | Discharge status: Home / Self Care | Payer: Medicare Other | Attending: Family Medicine | Admitting: Family Medicine

## 2020-08-26 ENCOUNTER — Other Ambulatory Visit: Payer: Self-pay

## 2020-08-26 DIAGNOSIS — R0981 Nasal congestion: Secondary | ICD-10-CM

## 2020-08-26 DIAGNOSIS — J0110 Acute frontal sinusitis, unspecified: Secondary | ICD-10-CM

## 2020-08-26 DIAGNOSIS — R059 Cough, unspecified: Secondary | ICD-10-CM

## 2020-08-26 DIAGNOSIS — J3489 Other specified disorders of nose and nasal sinuses: Secondary | ICD-10-CM

## 2020-08-26 DIAGNOSIS — R6883 Chills (without fever): Secondary | ICD-10-CM | POA: Diagnosis not present

## 2020-08-26 MED ORDER — BENZONATATE 100 MG PO CAPS: 100.0000 mg | ORAL_CAPSULE | Freq: Three times a day (TID) | ORAL | 0 refills | Status: DC

## 2020-08-26 MED ORDER — AZITHROMYCIN 250 MG PO TABS: 250.0000 mg | ORAL_TABLET | Freq: Every day | ORAL | 0 refills | Status: DC

## 2020-08-26 NOTE — Discharge Instructions (Signed)
I have sent in azithromycin for you to take. Take 2 tablets today, then one tablet daily for the next 4 days.  I have sent in tessalon perles for you to use one capsule every 8 hours as needed for cough.  Follow up with this office or with primary care if symptoms are persisting.  Follow up in the ER for high fever, trouble swallowing, trouble breathing, other concerning symptoms.  

## 2020-08-26 NOTE — ED Provider Notes (Signed)
[place]   [id number] 08/26/20 Arrival Time: 8315  VV:OHYW THROAT  SUBJECTIVE: History from: patient.  Regina Moody is a 79 y.o. female who presents with abrupt onset of nasal congestion, headache, fatigue, cough.  Reports that she has been experiencing sinus pain for the last 2 days.  Reports that she has been having trouble with her allergies for the last couple of weeks.  Denies sick exposure to Covid, strep, flu or mono, or precipitating event. Has negative history of Covid. Has not had Covid vaccines. Has tried Tylenol without relief. There are no aggravating symptoms. Denies previous symptoms in the past.     Denies fever, chills, ear pain, rhinorrhea, SOB, wheezing, chest pain, nausea, rash, changes in bowel or bladder habits.    ROS: As per HPI.  All other pertinent ROS negative.     Past Medical History:  Diagnosis Date  . Frequent headaches   . Osteoporosis    Past Surgical History:  Procedure Laterality Date  . ABDOMINAL HYSTERECTOMY    . CESAREAN SECTION     No Known Allergies No current facility-administered medications on file prior to encounter.   Current Outpatient Medications on File Prior to Encounter  Medication Sig Dispense Refill  . alendronate (FOSAMAX) 70 MG tablet Take 70 mg by mouth once a week.    Marland Kitchen aspirin EC 81 MG tablet Take 81 mg by mouth daily. Swallow whole.    . butalbital-acetaminophen-caffeine (FIORICET) 50-325-40 MG tablet Take by mouth 3 (three) times daily as needed for headache.    . cyclobenzaprine (FLEXERIL) 10 MG tablet Take 1 tablet (10 mg total) by mouth at bedtime as needed for muscle spasms. 30 tablet 3  . ibuprofen (ADVIL) 200 MG tablet Take 200 mg by mouth every 6 (six) hours as needed for mild pain.    . Nitrofurantoin Macrocrystal (MACRODANTIN PO) Take by mouth. Prn urinary symptoms     Social History   Socioeconomic History  . Marital status: Married    Spouse name: Not on file  . Number of children: Not  on file  . Years of education: Not on file  . Highest education level: Not on file  Occupational History  . Not on file  Tobacco Use  . Smoking status: Never Smoker  . Smokeless tobacco: Never Used  Vaping Use  . Vaping Use: Never used  Substance and Sexual Activity  . Alcohol use: No  . Drug use: Never  . Sexual activity: Not on file  Other Topics Concern  . Not on file  Social History Narrative  . Not on file   Social Determinants of Health   Financial Resource Strain: Not on file  Food Insecurity: Not on file  Transportation Needs: Not on file  Physical Activity: Not on file  Stress: Not on file  Social Connections: Not on file  Intimate Partner Violence: Not on file   No family history on file.  OBJECTIVE:  Vitals:   08/26/20 1034 08/26/20 1035  BP:  (!) 150/71  Pulse:  79  Resp:  18  Temp:  97.7 F (36.5 C)  TempSrc:  Oral  SpO2:  96%  Weight: 100 lb (45.4 kg)   Height: 4\' 11"  (1.499 m)      General appearance: alert; appears fatigued, but nontoxic, speaking in full sentences and managing own secretions HEENT: NCAT; Ears: EACs clear, TMs pearly gray with visible cone of light, without erythema; Eyes: PERRL, EOMI grossly; Nose: no obvious rhinorrhea; Throat: oropharynx mildly  erythematous, cobblestoning present, tonsils 1+ and mildly erythematous without white tonsillar exudates, uvula midline; Sinuses: Maxillary sinuses tender to palpation Neck: supple with LAD Lungs: CTA bilaterally without adventitious breath sounds; mild cough present Heart: regular rate and rhythm.  Radial pulses 2+ symmetrical bilaterally Skin: warm and dry Psychological: alert and cooperative; normal mood and affect  LABS: No results found for this or any previous visit (from the past 24 hour(s)).   ASSESSMENT & PLAN:  1. Acute non-recurrent frontal sinusitis   2. Chills   3. Sinus pain   4. Cough   5. Nasal congestion     Meds ordered this encounter  Medications  .  azithromycin (ZITHROMAX) 250 MG tablet    Sig: Take 1 tablet (250 mg total) by mouth daily. Take first 2 tablets together, then 1 every day until finished.    Dispense:  6 tablet    Refill:  0    Order Specific Question:   Supervising Provider    Answer:   Chase Picket [id number]  . benzonatate (TESSALON) 100 MG capsule    Sig: Take 1 capsule (100 mg total) by mouth every 8 (eight) hours.    Dispense:  21 capsule    Refill:  0    Order Specific Question:   Supervising Provider    Answer:   Chase Picket [id number]    Prescribed azithromycin Prescribed Tessalon Perles Declines COVID and flu testing today Take as directed and to completion.  Drink warm or cool liquids, use throat lozenges, or popsicles to help alleviate symptoms Take OTC ibuprofen or tylenol as needed for pain May use Zyrtec D and flonase to help alleviate symptoms Follow up with PCP if symptoms persist Return or go to ER if you have any new or worsening symptoms such as fever, chills, nausea, vomiting, worsening sore throat, cough, abdominal pain, chest pain, changes in bowel or bladder habits.   Reviewed expectations re: course of current medical issues. Questions answered. Outlined signs and symptoms indicating need for more acute intervention. Patient verbalized understanding. After Visit Summary given.          [name], NP 08/29/20 [phone]

## 2020-08-26 NOTE — ED Triage Notes (Signed)
Headache, cough, nasal congestion and hoarse voice since yesterday.

## 2020-08-28 IMAGING — MG DIGITAL SCREENING BILATERAL MAMMOGRAM WITH TOMO AND CAD
8 series · 9 of 24 positions shown · non-contrast
Comparison: Previous exam(s).

CLINICAL DATA: Screening.

EXAM:
DIGITAL SCREENING BILATERAL MAMMOGRAM WITH TOMO AND CAD

[R MLO synth-2D]
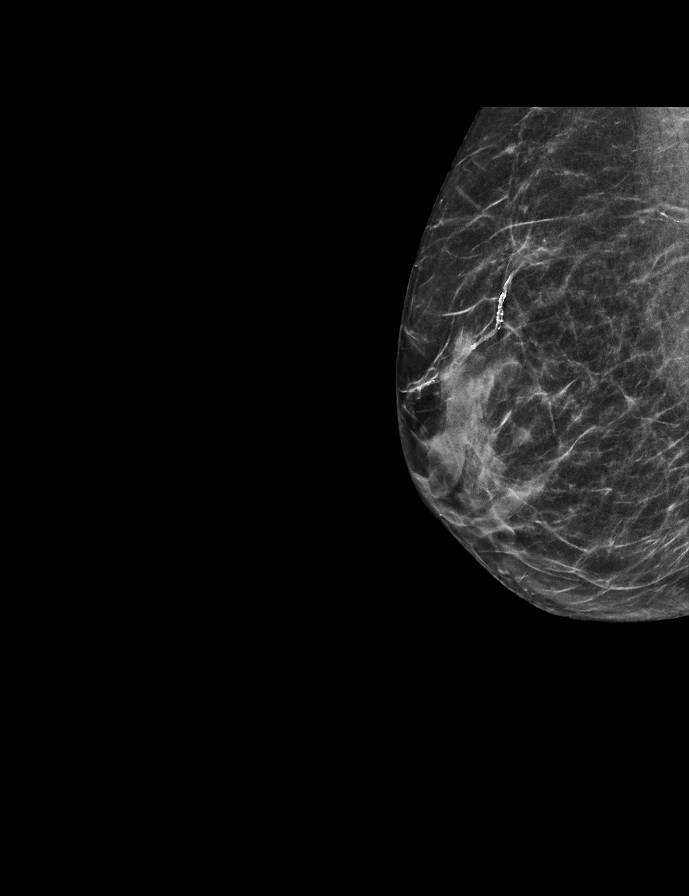

[L MLO synth-2D]
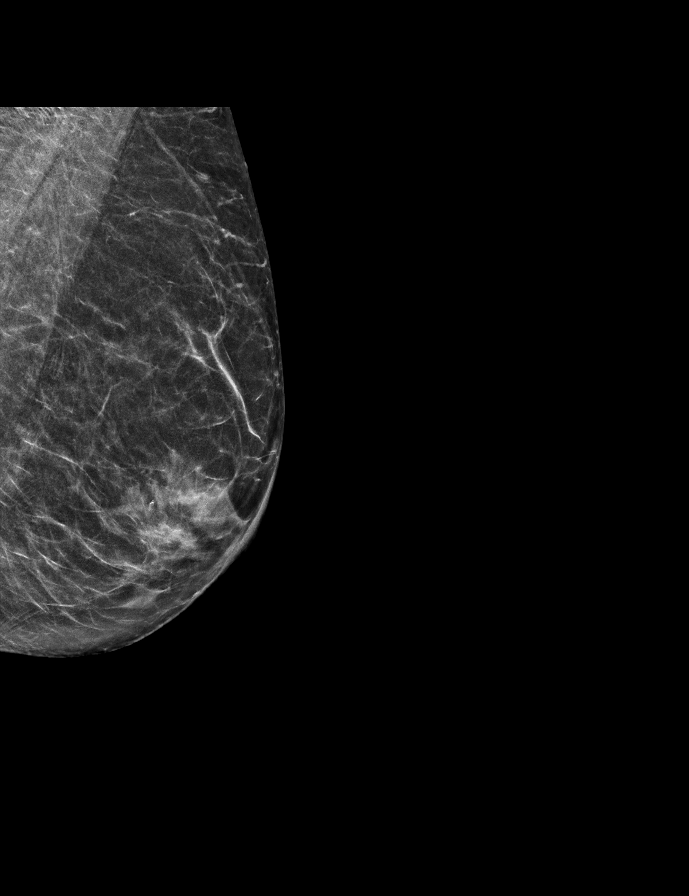

[L CC synth-2D]
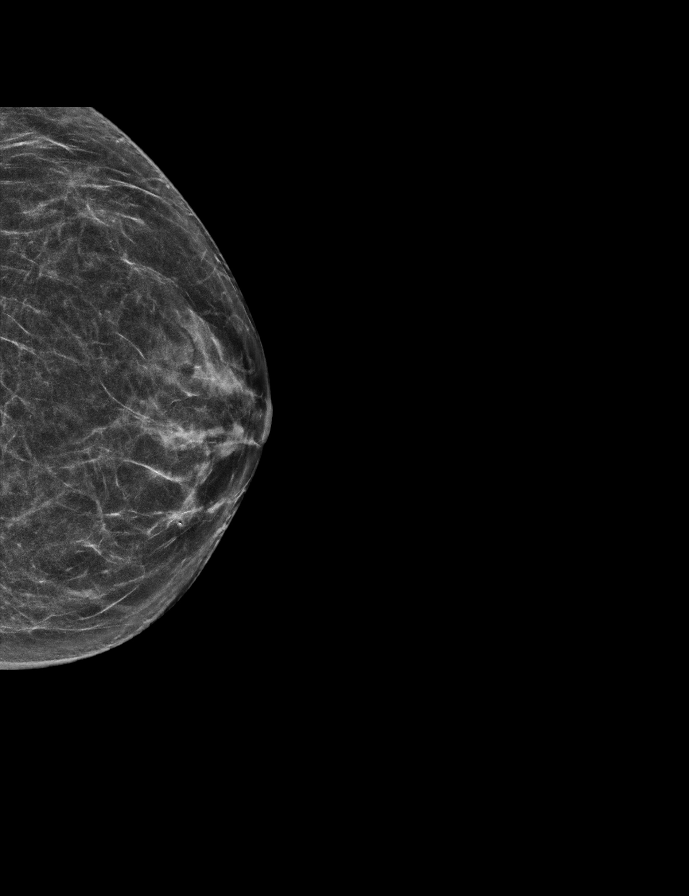

[R CC synth-2D]
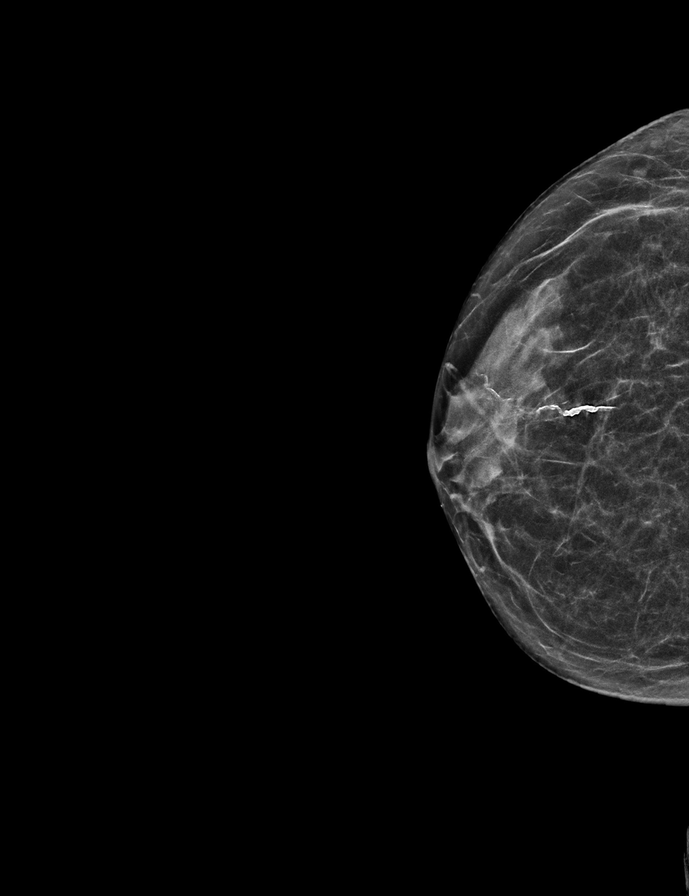

[L MLO tomo · 2 of 52 frames shown]
[frame 17/52]
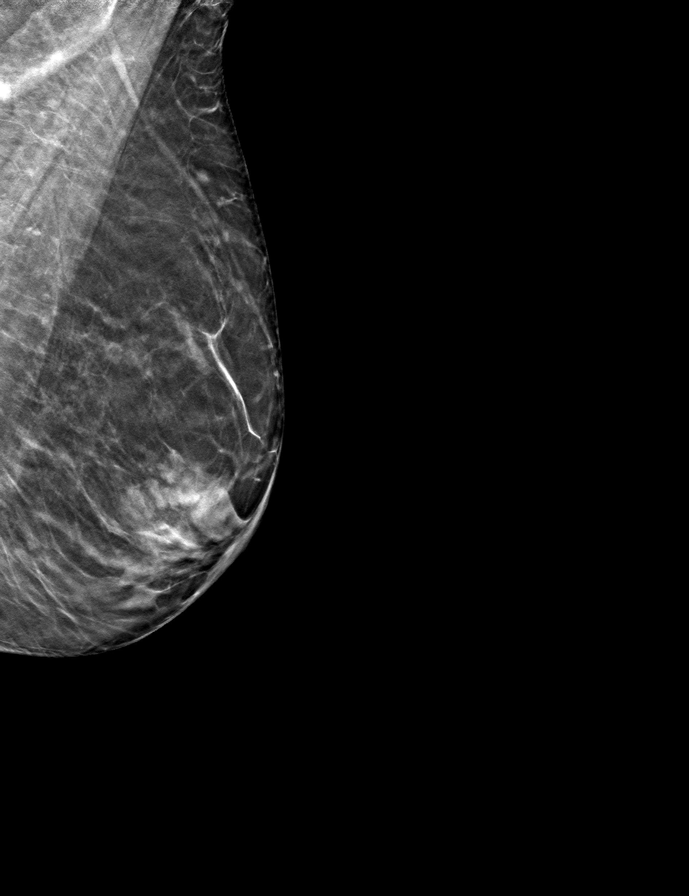
[frame 27/52]
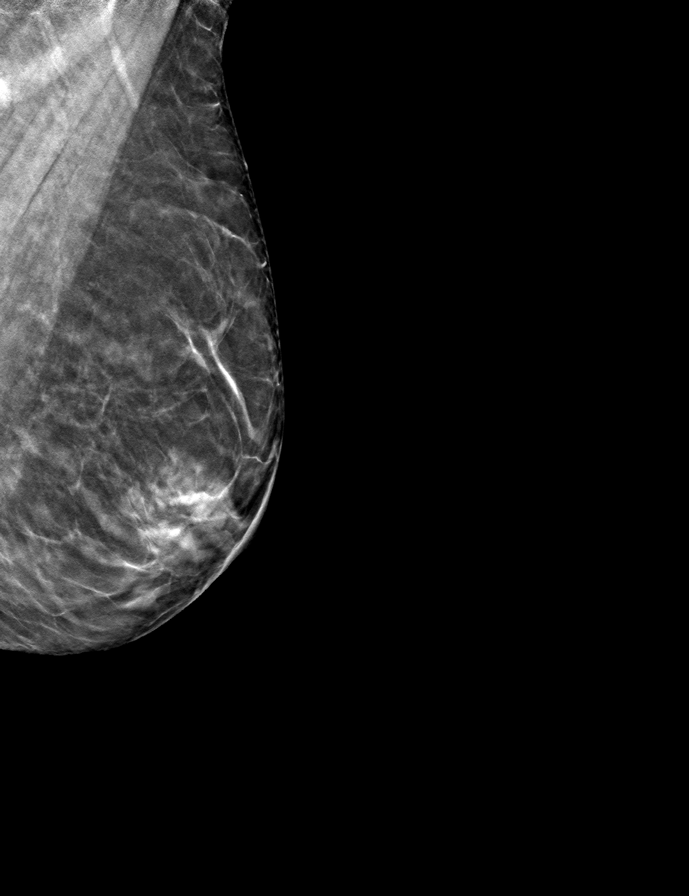

[R CC tomo · tomo slice 25/48.0]
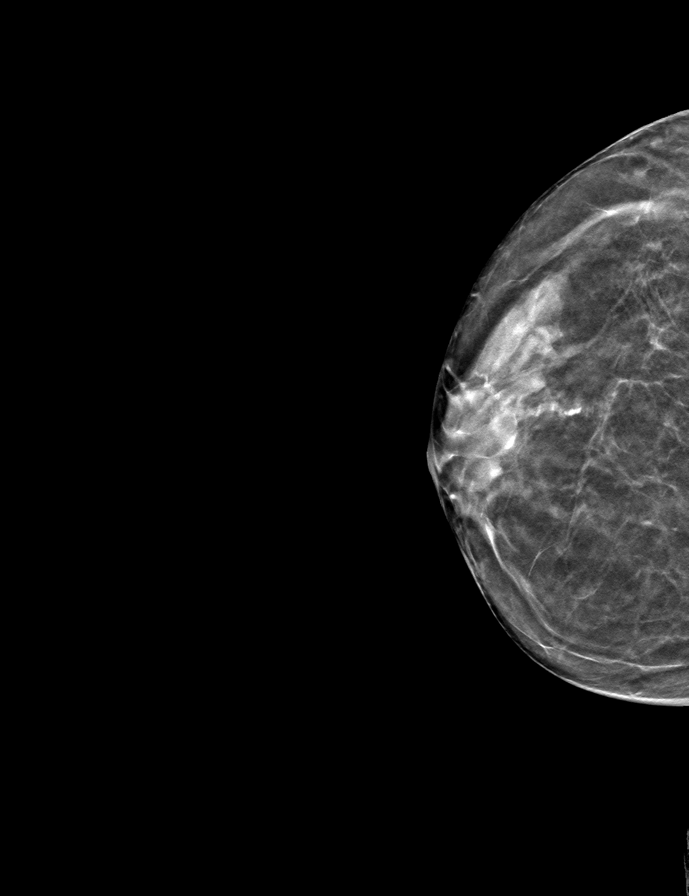

[R MLO tomo · tomo slice 25/49.0]
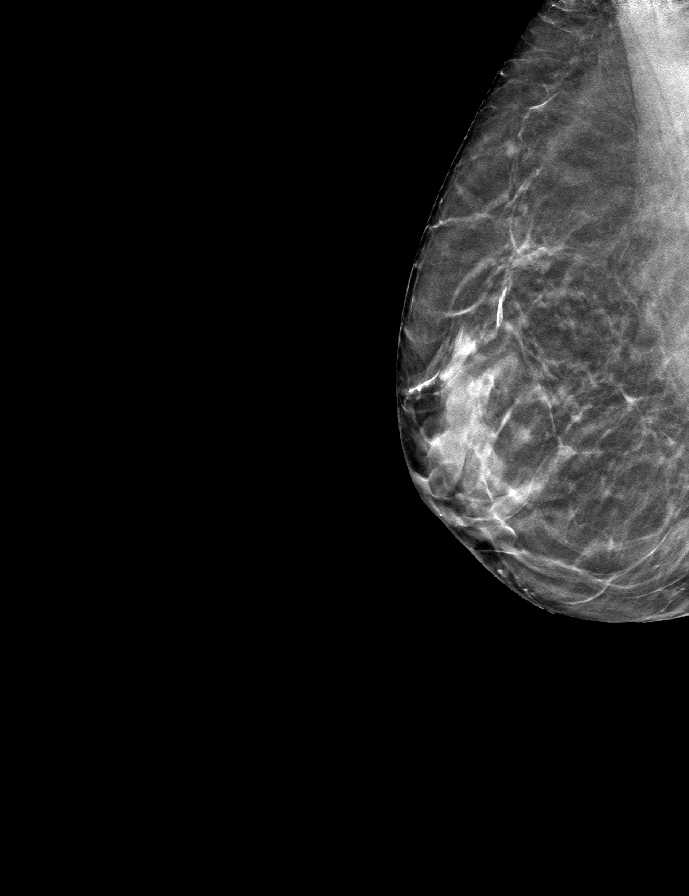

[L CC tomo · tomo slice 27/52.0]
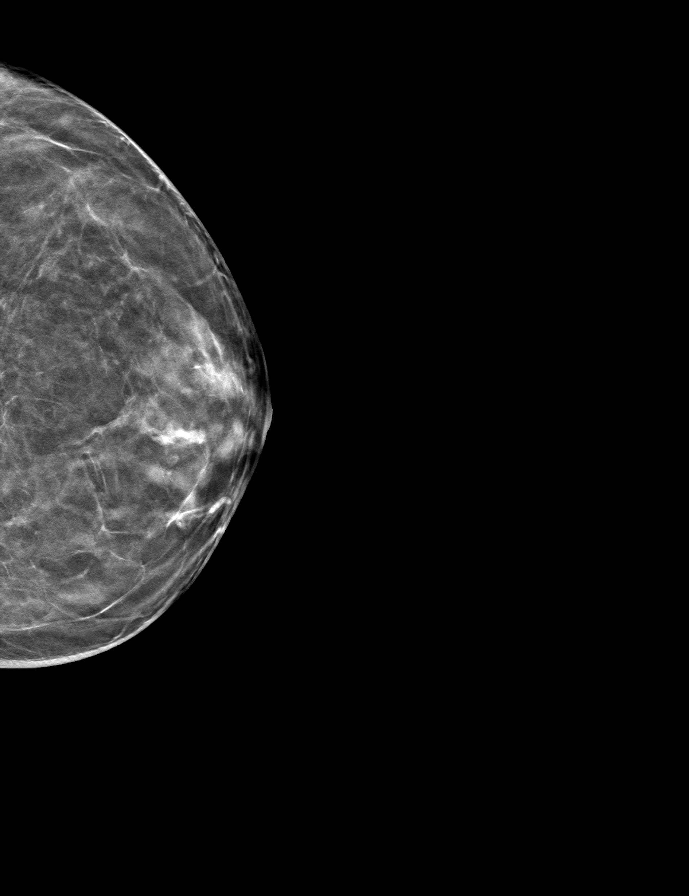

[9 of 24 positions shown; findings below may reference images not displayed]

ACR Breast Density Category c: The breast tissue is heterogeneously
dense, which may obscure small masses.
FINDINGS: There are no findings suspicious for malignancy. Images were
processed with CAD.
IMPRESSION: No mammographic evidence of malignancy. A result letter of this
screening mammogram will be mailed directly to the patient.

RECOMMENDATION:
Screening mammogram in one year. (Code:[id number]-[id number])

BI-RADS CATEGORY  1: Negative.

## 2020-09-16 DIAGNOSIS — Z23 Encounter for immunization: Secondary | ICD-10-CM | POA: Diagnosis not present

## 2020-10-21 IMAGING — CT CT HEAD W/O CM
3 of 4 series · 16 of 47 positions shown, 19 images · non-contrast
Comparison: None.

CLINICAL DATA: Headache

EXAM:
CT HEAD WITHOUT CONTRAST
TECHNIQUE: Contiguous axial images were obtained from the base of the skull
through the vertex without intravenous contrast.

[Series 2: head 5.00 hr40 s3 axial ibhc · axial · 0.40mm/px · z∈[-588,-458]mm · 10 of 32 slices shown, 13 images]
[im 3/32  brain]
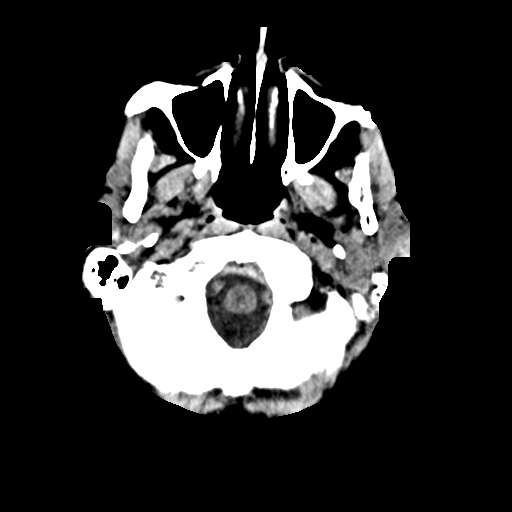
[im 3/32  bone]
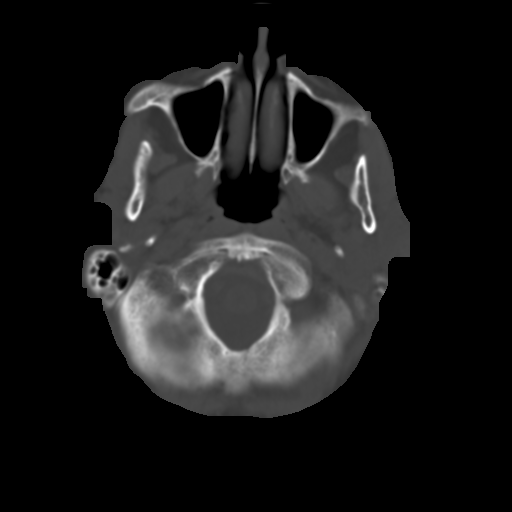
[im 5/32  brain]
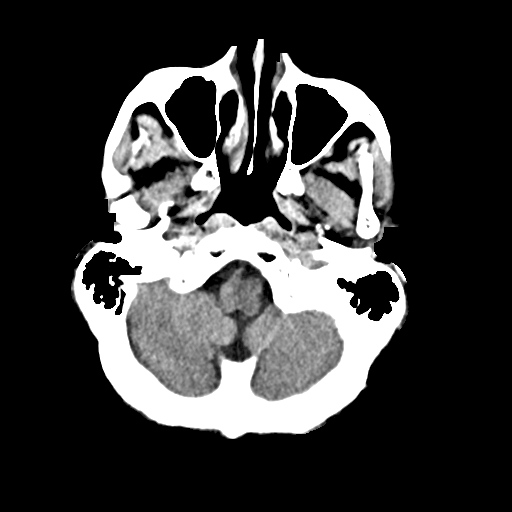
[im 9/32  brain]
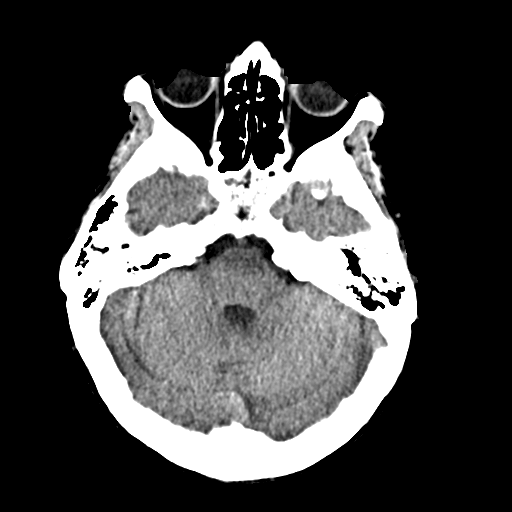
[im 12/32  brain]
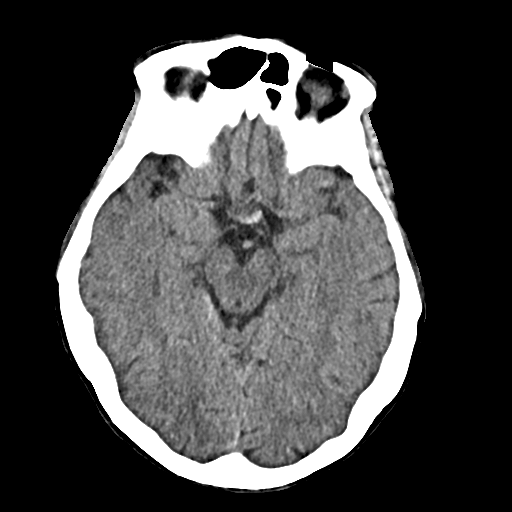
[im 14/32  brain]
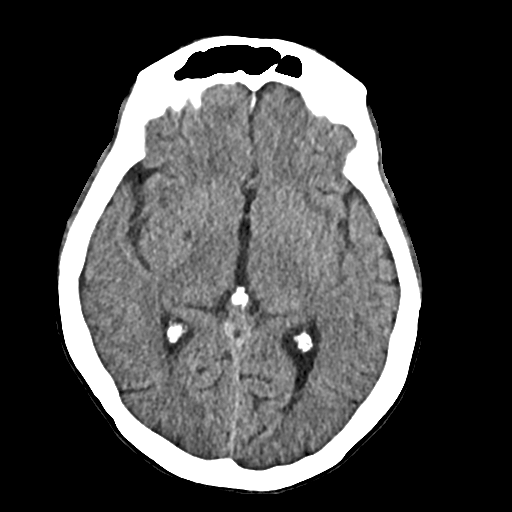
[im 14/32  bone]
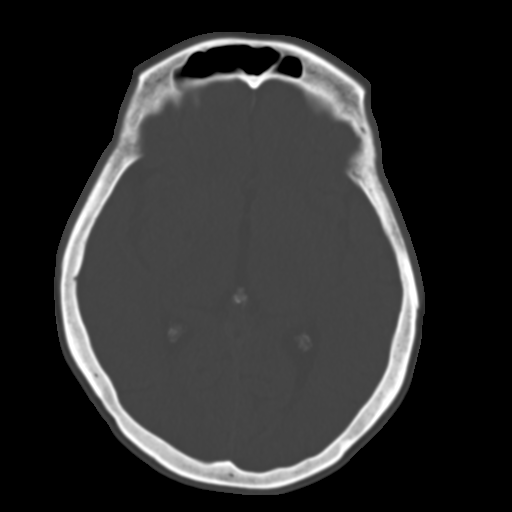
[im 18/32  brain]
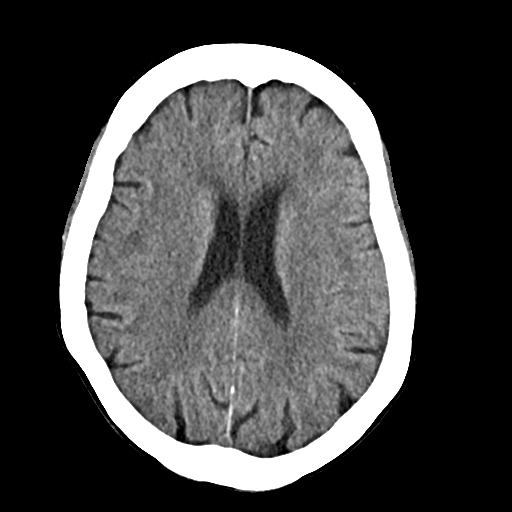
[im 20/32  brain]
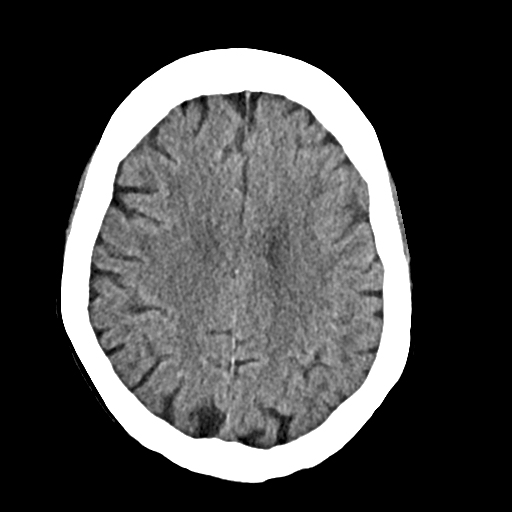
[im 23/32  brain]
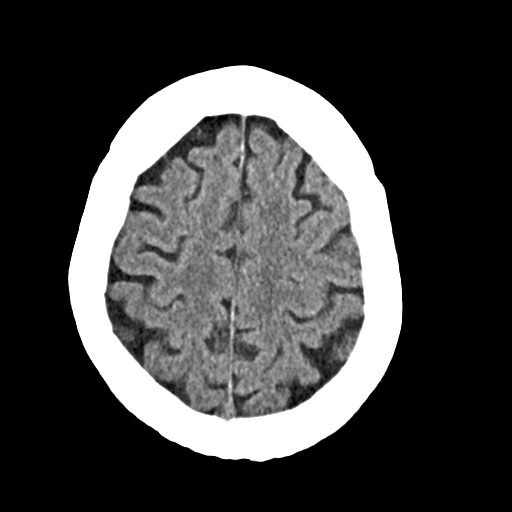
[im 27/32  brain]
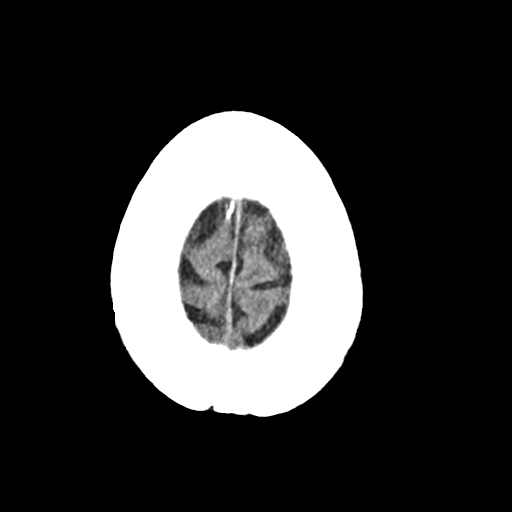
[im 27/32  bone]
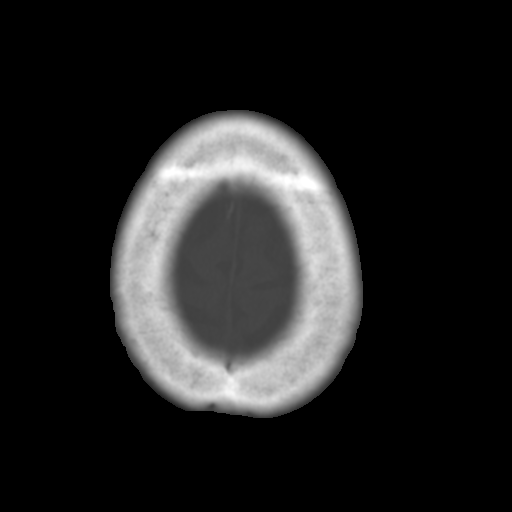
[im 29/32  brain]
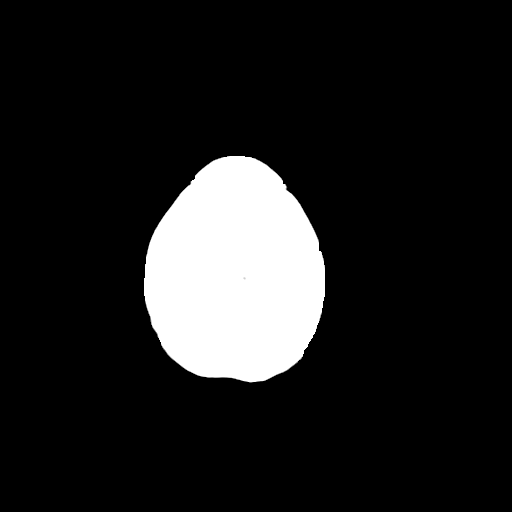

[Series 4: head 3.00 hr40 s3 sag · sagittal · 0.32mm/px · 3 of 60 slices shown]
[im 20/60  brain]
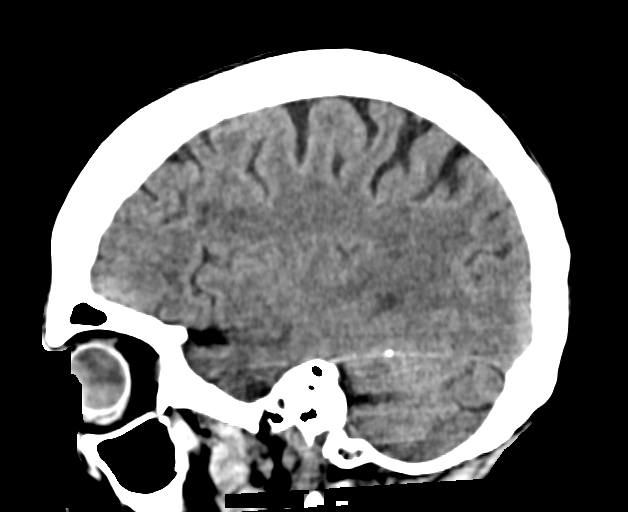
[im 30/60  brain]
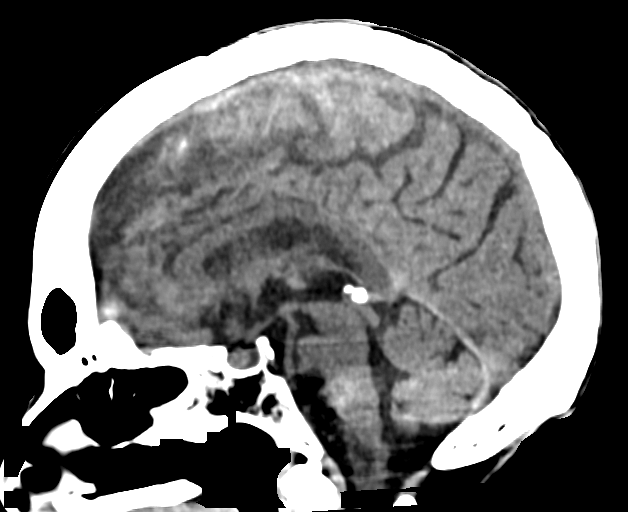
[im 40/60  brain]
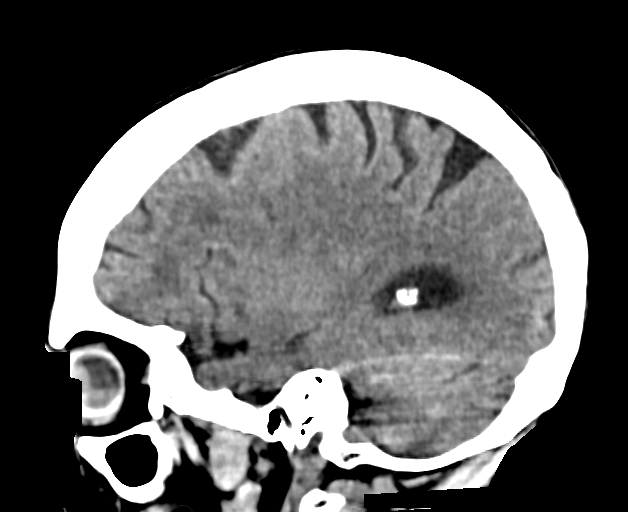

[Series 6: head 3.00 hr40 s3 cor · coronal · 0.31mm/px · 3 of 66 slices shown]
[im 22/66  brain]
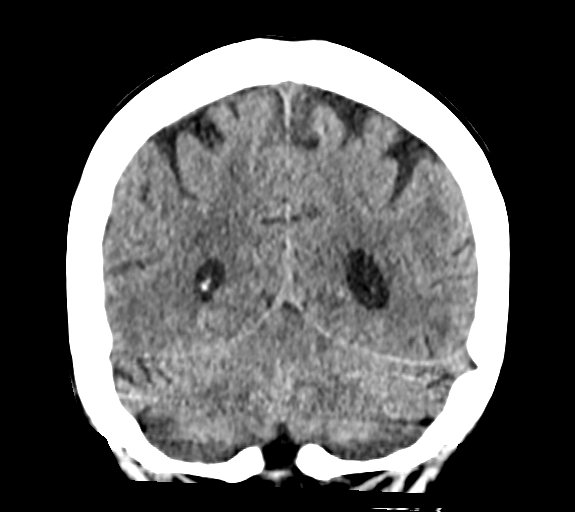
[im 29/66  brain]
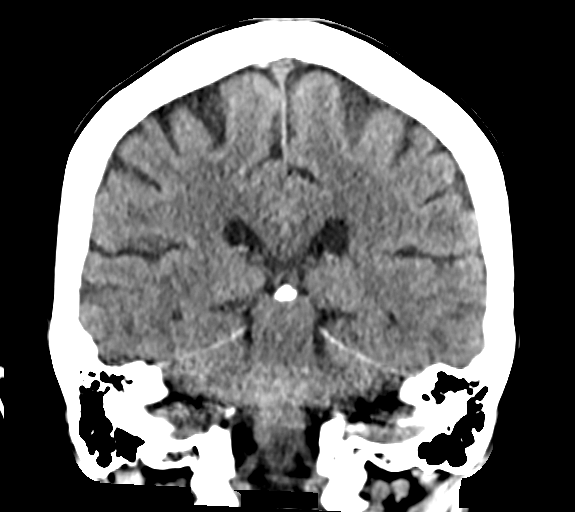
[im 37/66  brain]
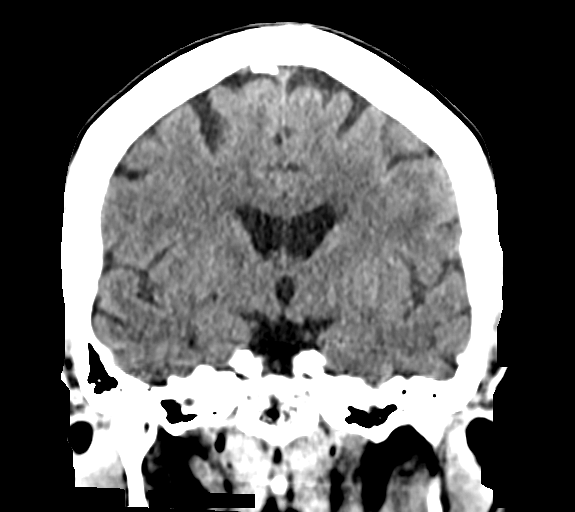

[16 of 47 positions shown; findings below may reference images not displayed]

FINDINGS: Brain: No acute intracranial abnormality. Specifically, no
hemorrhage, hydrocephalus, mass lesion, acute infarction, or
significant intracranial injury. Mild chronic small vessel disease
throughout the deep white matter.

Vascular: No hyperdense vessel or unexpected calcification.

Skull: No acute calvarial abnormality.

Sinuses/Orbits: Visualized paranasal sinuses and mastoids clear.
Orbital soft tissues unremarkable.

Other: None
IMPRESSION: Chronic small vessel disease throughout the deep white matter. No
acute intracranial abnormality.

## 2020-10-26 ENCOUNTER — Ambulatory Visit (HOSPITAL_COMMUNITY)
Admission: RE | Admit: 2020-10-26 | Discharge: 2020-10-26 | Disposition: A | Discharge status: Home / Self Care | Payer: Medicare Other | Source: Ambulatory Visit | Attending: Vascular Surgery | Admitting: Vascular Surgery

## 2020-10-26 ENCOUNTER — Other Ambulatory Visit: Payer: Self-pay

## 2020-10-26 ENCOUNTER — Encounter: Payer: Self-pay | Admitting: Vascular Surgery

## 2020-10-26 ENCOUNTER — Ambulatory Visit (INDEPENDENT_AMBULATORY_CARE_PROVIDER_SITE_OTHER): Payer: Medicare Other | Admitting: Vascular Surgery

## 2020-10-26 VITALS — BP 120/74 | HR 76 | Temp 98.0°F | Resp 20 | Ht 59.0 in | Wt 100.0 lb

## 2020-10-26 DIAGNOSIS — I739 Peripheral vascular disease, unspecified: Secondary | ICD-10-CM

## 2020-10-26 NOTE — Progress Notes (Signed)
REASON FOR VISIT:   Follow-up of peripheral vascular disease  MEDICAL ISSUES:   PERIPHERAL VASCULAR DISEASE: This patient has evidence of infrainguinal arterial occlusive disease on the left.  Her left lower extremity claudication symptoms are stable.  Her ABIs have actually improved from 39% to 49%.  I have encouraged her to remain as active as possible.  She is not a smoker.  She is on aspirin.  I think we can stretch her follow-up up to 1 year and I have ordered follow-up ABIs at that time.  She knows to call sooner if she has problems.  HPI:   Regina Moody is a pleasant 80 y.o. female who I last saw on 04/06/2020 with peripheral vascular disease.  At that time she had an ABI of 100% on the right and on the left side the ABI was 39%.  She had stable claudication.  On exam she had evidence of infrainguinal arterial occlusive disease.  Given her age I do not want a pursue an aggressive work-up unless her symptoms progress significantly.  She comes in for 4-month follow-up visit.  Since I saw her last, she has stable left calf claudication.  Her symptoms are brought on by ambulation and relieved with rest.  She can walk a little bit further since the last time I saw her.  She denies any history of rest pain or nonhealing ulcers.  She does complain that her left foot gets cold sometimes.  There have been no significant changes in her medical history.  She is not a smoker.  She is on aspirin.  Past Medical History:  Diagnosis Date  . Frequent headaches   . Osteoporosis     History reviewed. No pertinent family history.  SOCIAL HISTORY: Social History   Tobacco Use  . Smoking status: Never Smoker  . Smokeless tobacco: Never Used  Substance Use Topics  . Alcohol use: No    No Known Allergies  Current Outpatient Medications  Medication Sig Dispense Refill  . alendronate (FOSAMAX) 70 MG tablet Take 70 mg by mouth once a week.    Marland Kitchen aspirin EC 81 MG tablet Take 81 mg by mouth  daily. Swallow whole.    . butalbital-acetaminophen-caffeine (FIORICET) 50-325-40 MG tablet Take by mouth 3 (three) times daily as needed for headache.    . cyclobenzaprine (FLEXERIL) 10 MG tablet Take 1 tablet (10 mg total) by mouth at bedtime as needed for muscle spasms. 30 tablet 3  . ibuprofen (ADVIL) 200 MG tablet Take 200 mg by mouth every 6 (six) hours as needed for mild pain.    . Nitrofurantoin Macrocrystal (MACRODANTIN PO) Take by mouth. Prn urinary symptoms     No current facility-administered medications for this visit.    REVIEW OF SYSTEMS:  [X]  denotes positive finding, [ ]  denotes negative finding Cardiac  Comments:  Chest pain or chest pressure:    Shortness of breath upon exertion:    Short of breath when lying flat:    Irregular heart rhythm:        Vascular    Pain in calf, thigh, or hip brought on by ambulation:    Pain in feet at night that wakes you up from your sleep:     Blood clot in your veins:    Leg swelling:         Pulmonary    Oxygen at home:    Productive cough:     Wheezing:  Neurologic    Sudden weakness in arms or legs:     Sudden numbness in arms or legs:     Sudden onset of difficulty speaking or slurred speech:    Temporary loss of vision in one eye:     Problems with dizziness:         Gastrointestinal    Blood in stool:     Vomited blood:         Genitourinary    Burning when urinating:     Blood in urine:        Psychiatric    Major depression:         Hematologic    Bleeding problems:    Problems with blood clotting too easily:        Skin    Rashes or ulcers:        Constitutional    Fever or chills:     PHYSICAL EXAM:   Vitals:   10/26/20 1039  BP: 120/74  Pulse: 76  Resp: 20  Temp: 98 F (36.7 C)  SpO2: 98%  Weight: 45.4 kg  Height: 4\' 11"  (1.499 m)    GENERAL: The patient is a well-nourished female, in no acute distress. The vital signs are documented above. CARDIAC: There is a regular rate and  rhythm.  VASCULAR: I do not detect carotid bruits. She has palpable femoral pulses. On the right side she has a diminished but palpable dorsalis pedis pulse. I cannot palpate pulses on the left foot.  The left foot is warm and well-perfused however. She has no ischemic ulcers. She has no significant lower extremity swelling. PULMONARY: There is good air exchange bilaterally without wheezing or rales. ABDOMEN: Soft and non-tender with normal pitched bowel sounds.  MUSCULOSKELETAL: There are no major deformities or cyanosis. NEUROLOGIC: No focal weakness or paresthesias are detected. SKIN: There are no ulcers or rashes noted. PSYCHIATRIC: The patient has a normal affect.  DATA:    ARTERIAL DOPPLER STUDY: I have independently interpreted her arterial Doppler study today.  On the right side, there is a triphasic dorsalis pedis and posterior tibial signal.  ABIs 100%.  Toe pressure 74 mmHg.  On the left side there is a biphasic posterior tibial signal with a monophasic dorsalis pedis signal.  ABIs 49%.  Toe pressure is 0.  Deitra Mayo [facility] [phone]

## 2021-03-09 DIAGNOSIS — M810 Age-related osteoporosis without current pathological fracture: Secondary | ICD-10-CM | POA: Diagnosis not present

## 2021-03-09 DIAGNOSIS — I739 Peripheral vascular disease, unspecified: Secondary | ICD-10-CM | POA: Diagnosis not present

## 2021-03-09 DIAGNOSIS — J309 Allergic rhinitis, unspecified: Secondary | ICD-10-CM | POA: Diagnosis not present

## 2021-03-09 DIAGNOSIS — E7800 Pure hypercholesterolemia, unspecified: Secondary | ICD-10-CM | POA: Diagnosis not present

## 2021-04-21 DIAGNOSIS — D3132 Benign neoplasm of left choroid: Secondary | ICD-10-CM | POA: Diagnosis not present

## 2021-04-21 DIAGNOSIS — Z961 Presence of intraocular lens: Secondary | ICD-10-CM | POA: Diagnosis not present

## 2021-05-17 ENCOUNTER — Encounter: Payer: Self-pay | Admitting: Family Medicine

## 2021-05-17 ENCOUNTER — Ambulatory Visit: Payer: Medicare Other | Admitting: Family Medicine

## 2021-05-17 ENCOUNTER — Ambulatory Visit (INDEPENDENT_AMBULATORY_CARE_PROVIDER_SITE_OTHER): Payer: Medicare Other | Admitting: Family Medicine

## 2021-05-17 VITALS — BP 117/58 | HR 73 | Ht 59.0 in | Wt 97.5 lb

## 2021-05-17 DIAGNOSIS — G44209 Tension-type headache, unspecified, not intractable: Secondary | ICD-10-CM | POA: Diagnosis not present

## 2021-05-17 NOTE — Patient Instructions (Signed)
Below is our plan:  We will continue to monitor headache.   Please make sure you are staying well hydrated. I recommend 50-60 ounces daily. Well balanced diet and regular exercise encouraged. Consistent sleep schedule with 6-8 hours recommended.   Please continue follow up with care team as directed.   Follow up with me in as needed   You may receive a survey regarding today's visit. I encourage you to leave honest feed back as I do use this information to improve patient care. Thank you for seeing me today!

## 2021-05-17 NOTE — Progress Notes (Addendum)
PATIENT: Regina Moody DOB: July 04, 1941  REASON FOR VISIT: follow up HISTORY FROM: patient  Chief Complaint  Patient presents with   Follow-up    Rm 2, alone. Here for yearly tension HA f/u. Pt reports doing well. Has a HA about every other month. Has cut down on her caffeine.       HISTORY OF PRESENT ILLNESS: 05/17/21 ALL: Regina Moody returns for follow up for headaches. She continues to do very well. She may have a mild headache ever 2 months. Advil works well for abortive therapy. She is followed annually by PCP. No falls. She lives alone and maintains her home independently. She is feeling well and without complaints.   05/16/2020 ALL: Regina Moody is a 80 y.o. female here today for follow up for tension style headaches. She reports that she is doing well. She reports headaches have nearly resolved. She rarely has a headache. She doesn't use Flexeril much at all. She is working with a vascular provider for PAD. She is having claudication of left calf. She is managed with daily asa 69m. She is doing better with water intake. She continues to see PCP regularly. She remains very active. She lives alone. 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 No falls.   She did have an accident last week where a piece of concrete fell on the right side of her head. She was pulling a box off of a shelf in her garage and the concrete piece was sitting on top. She did have significant bruising of her right eye but denies LOC, dizziness, headaches, vision changes. She did not seek ER eval. She is feeling well today and without complaints.    HISTORY: (copied from Dr AGuadelupe Sabinnote on 11/09/2019)  Ms. MWeryis a 80year old right-handed woman with an underlying medical history of hyperlipidemia, allergic rhinitis, osteoporosis and chronic cystitis, who presents for follow-up consultation of her recurrent headaches.  The patient is unaccompanied today.  I first met her at the request of her  primary care physician on 08/11/2019, at which time she reported recurrent headaches for a few weeks, about 2 months.  Prior history was not telltale for migraines.  She had likely tension type headaches.  I suggested a cautious trial of Flexeril at night as needed.  I also cautioned her regarding the use of Fioricet for headaches.  I did ask her to have some blood work and also proceed with a brain MRI as well as MRA head.  Blood work including CMP, RPR, ESR, rheumatoid factor, CRP, ANA was unremarkable.  She had a brain MRI with and without contrast as well as MRA head without contrast on 09/07/2019 and I reviewed the results:   IMPRESSION:    Unremarkable MRA head (without).  No significant stenosis or occlusion.   IMPRESSION:    MRI brain (with and without) demonstrating: - Moderate periventricular and subcortical chronic small vessel ischemic disease.  - No acute findings.     We called her with her test results.     Today, 11/09/2019: She reports feeling better.  She ended up taking the Flexeril only once.  She thought it helped but did not feel she had to take it again.  She has been exercising on a regular basis, tries to rest and off, feels that overall she is better.  She has not had any new symptoms and feels well.  She had both Covid vaccination doses.     The patient's  allergies, current medications, family history, past medical history, past social history, past surgical history and problem list were reviewed and updated as appropriate.    Previously:    06/11/2019: (She) reports recurrent headaches for the past few weeks, probably about 2 months now, started in late September.  She reports having a bandlike sensation across the head, sometimes around the head, sometimes in the back of the head, sometimes in the bitemporal areas.  It is constant and achy.  She reports a prior history of migraine headaches when she was in her 8s, she was told that by age 64 or 20 she would not have any  more headaches, she had seen a neurologist and also had testing and treatment at the time, she eventually did not have any more migraines.  She currently does not have any associated photophobia, noise sensitivity, nausea or vomiting.  She denies any one-sided weakness or numbness or tingling or droopy face or slurring of speech, she has no visual disturbance, in particular, no loss of vision, no blurry vision, and no visual aura.  She had cataract surgeries in February and March of this year and did well, she uses over-the-counter readers now. I reviewed her primary care office note from 06/16/2019.  She reported recurrent left-sided headaches for the previous 3 weeks.  She tried Fioricet but it made her sleepy.  She likes to drink coffee, she drinks about 6 or 7 cups of coffee per day, estimates that she drinks up to 2 bottles of water per day, 16 ounce each.  She does not drink any alcohol and is a non-smoker.  She is widowed and lives by herself.  Her husband passed away 7 years ago.  She is retired, she lost her only son to cancer when he was 91.  She has 2 grand children who are now in their 30s.  [place], she has a larger home and large property and maintains the property herself, she mows the lawn herself.  She does endorse stress and feeling overwhelmed and she has been thinking about downsizing and moving to a smaller place, selling her property and may be even having an auction.  She had a head CT without contrast on 06/17/2019 and I reviewed the results: IMPRESSION: Chronic small vessel disease throughout the deep white matter. No acute intracranial abnormality. I reviewed your encounter note from 06/22/2019.  She went to urgent care.  She had blood work at the time including CBC with differential, platelet count was borderline high at 409, ESR was 80, CMP showed potassium borderline elevated at 5.4, BUN 18, creatinine 0.87.  A brain MRI and MRA were ordered but as I  understand were not approved by her insurance.   She does not report any snoring or gasping sensation at night but has woken up with a headache in the middle of the night.  She has never had a sleep study.  REVIEW OF SYSTEMS: Out of a complete 14 system review of symptoms, the patient complains only of the following symptoms, headaches, and all other reviewed systems are negative.   ALLERGIES: No Known Allergies  HOME MEDICATIONS: Outpatient Medications Prior to Visit  Medication Sig Dispense Refill   alendronate (FOSAMAX) 70 MG tablet Take 70 mg by mouth once a week.     aspirin EC 81 MG tablet Take 81 mg by mouth daily. Swallow whole.     butalbital-acetaminophen-caffeine (FIORICET) 50-325-40 MG tablet Take by mouth 3 (three) times  daily as needed for headache.     cyclobenzaprine (FLEXERIL) 10 MG tablet Take 1 tablet (10 mg total) by mouth at bedtime as needed for muscle spasms. 30 tablet 3   ibuprofen (ADVIL) 200 MG tablet Take 200 mg by mouth every 6 (six) hours as needed for mild pain.     Nitrofurantoin Macrocrystal (MACRODANTIN PO) Take by mouth. Prn urinary symptoms     No facility-administered medications prior to visit.    PAST MEDICAL HISTORY: Past Medical History:  Diagnosis Date   Frequent headaches    Osteoporosis     PAST SURGICAL HISTORY: Past Surgical History:  Procedure Laterality Date   ABDOMINAL HYSTERECTOMY     CESAREAN SECTION      FAMILY HISTORY: History reviewed. No pertinent family history.  SOCIAL HISTORY: Social History   Socioeconomic History   Marital status: Married    Spouse name: Not on file   Number of children: Not on file   Years of education: Not on file   Highest education level: Not on file  Occupational History   Not on file  Tobacco Use   Smoking status: Never   Smokeless tobacco: Never  Vaping Use   Vaping Use: Never used  Substance and Sexual Activity   Alcohol use: No   Drug use: Never   Sexual activity: Not on file   Other Topics Concern   Not on file  Social History Narrative   Not on file   Social Determinants of Health   Financial Resource Strain: Not on file  Food Insecurity: Not on file  Transportation Needs: Not on file  Physical Activity: Not on file  Stress: Not on file  Social Connections: Not on file  Intimate Partner Violence: Not on file      PHYSICAL EXAM  Vitals:   05/17/21 1252  BP: (!) 117/58  Pulse: 73  Weight: 97 lb 8 oz (44.2 kg)  Height: 4' 11"  (1.499 m)    Body mass index is 19.69 kg/m.  Generalized: Well developed, in no acute distress  Cardiology: normal rate and rhythm, no murmur noted Respiratory: clear to auscultation bilaterally  Neurological examination  Mentation: Alert oriented to time, place, history taking. Follows all commands speech and language fluent Cranial nerve II-XII: Pupils were equal round reactive to light. Extraocular movements were full, visual field were full on confrontational test. Facial sensation and strength were normal.  Motor: The motor testing reveals 5 over 5 strength of all 4 extremities. Good symmetric motor tone is noted throughout.  Sensory: Sensory testing is intact to soft touch on all 4 extremities. No evidence of extinction is noted.  Gait and station: Gait is normal.    DIAGNOSTIC DATA (LABS, IMAGING, TESTING) - I reviewed patient records, labs, notes, testing and imaging myself where available.  No flowsheet data found.   Lab Results  Component Value Date   WBC 6.8 03/07/2016   HGB 13.3 03/07/2016   HCT 40.8 03/07/2016   MCV 96.2 03/07/2016   PLT 202 03/07/2016      Component Value Date/Time   NA 141 08/11/2019 1109   K 4.5 08/11/2019 1109   CL 103 08/11/2019 1109   CO2 23 08/11/2019 1109   GLUCOSE 71 08/11/2019 1109   GLUCOSE 102 (H) 03/07/2016 1015   BUN 13 08/11/2019 1109   CREATININE 0.73 08/11/2019 1109   CALCIUM 9.7 08/11/2019 1109   PROT 7.0 08/11/2019 1109   ALBUMIN 4.0 08/11/2019 1109    AST 19 08/11/2019 1109  ALT 11 08/11/2019 1109   ALKPHOS 116 08/11/2019 1109   BILITOT 0.3 08/11/2019 1109   GFRNONAA 79 08/11/2019 1109   GFRAA 91 08/11/2019 1109   No results found for: CHOL, HDL, LDLCALC, LDLDIRECT, TRIG, CHOLHDL No results found for: HGBA1C No results found for: VITAMINB12 Lab Results  Component Value Date   TSH 1.144 03/07/2016       ASSESSMENT AND PLAN 80 y.o. year old female  has a past medical history of Frequent headaches and Osteoporosis. here with     ICD-10-CM   1. Tension-type headache, not intractable, unspecified chronicity pattern  G44.209        Regina Moody is doing well , today. Headaches remain well controlled. She has not used Flexeril since last being seen in 2021. She will continue close follow up with care team. She will return to see me as needed. She verbalizes understanding and agreement with this plan.    No orders of the defined types were placed in this encounter.    No orders of the defined types were placed in this encounter.     Debbora Presto, FNP-C 05/17/2021, 12:55 PM [facility] [address] [place], [place] [address] [phone]  I reviewed the above note and documentation by the Nurse Practitioner and agree with the history, exam, assessment and plan as outlined above. I was available for consultation. Star Age, MD, PhD [facility])

## 2021-07-06 ENCOUNTER — Other Ambulatory Visit: Payer: Self-pay

## 2021-07-06 ENCOUNTER — Ambulatory Visit: Payer: Self-pay

## 2021-07-06 ENCOUNTER — Ambulatory Visit
Admission: EM | Admit: 2021-07-06 | Discharge: 2021-07-06 | Disposition: A | Discharge status: Home / Self Care | Payer: Medicare Other | Attending: Urgent Care | Admitting: Urgent Care

## 2021-07-06 DIAGNOSIS — R300 Dysuria: Secondary | ICD-10-CM | POA: Insufficient documentation

## 2021-07-06 DIAGNOSIS — R350 Frequency of micturition: Secondary | ICD-10-CM | POA: Insufficient documentation

## 2021-07-06 DIAGNOSIS — Z87440 Personal history of urinary (tract) infections: Secondary | ICD-10-CM | POA: Diagnosis not present

## 2021-07-06 LAB — POCT URINALYSIS DIP (MANUAL ENTRY)
Bilirubin, UA: NEGATIVE
Glucose, UA: NEGATIVE mg/dL
Ketones, POC UA: NEGATIVE mg/dL
Leukocytes, UA: NEGATIVE
Nitrite, UA: NEGATIVE
Spec Grav, UA: 1.02 (ref 1.010–1.025)
Urobilinogen, UA: 0.2 E.U./dL
pH, UA: 5 (ref 5.0–8.0)

## 2021-07-06 MED ORDER — PHENAZOPYRIDINE HCL 200 MG PO TABS: 200.0000 mg | ORAL_TABLET | Freq: Three times a day (TID) | ORAL | 0 refills | Status: DC | PRN

## 2021-07-06 NOTE — ED Triage Notes (Signed)
Two day h/o dysuria and urinary frequency. Pt is uncertain about hematuria. Pt reports that she had a few left over macrodantin that she took which helped decrease sxs.

## 2021-07-06 NOTE — Discharge Instructions (Addendum)

## 2021-07-06 NOTE — ED Provider Notes (Signed)
[facility]   MRN: [id number] DOB: [birth year]  Subjective:   Regina Moody is a 80 y.o. female presenting for 2 day history urinary frequency, dysuria. Has ~5 cups of coffee daily, sometimes more throughout the day. No fever, n/v, abdominal/pelvic pain, hematuria. Tries to hydrate well. Has seen multiple urologists in the past for the same.  She did use few doses of Macrodantin leftover from a previous prescription.  No current facility-administered medications for this encounter.  Current Outpatient Medications:    alendronate (FOSAMAX) 70 MG tablet, Take 70 mg by mouth once a week., Disp: , Rfl:    aspirin EC 81 MG tablet, Take 81 mg by mouth daily. Swallow whole., Disp: , Rfl:    butalbital-acetaminophen-caffeine (FIORICET) 50-325-40 MG tablet, Take by mouth 3 (three) times daily as needed for headache., Disp: , Rfl:    cyclobenzaprine (FLEXERIL) 10 MG tablet, Take 1 tablet (10 mg total) by mouth at bedtime as needed for muscle spasms., Disp: 30 tablet, Rfl: 3   ibuprofen (ADVIL) 200 MG tablet, Take 200 mg by mouth every 6 (six) hours as needed for mild pain., Disp: , Rfl:    Nitrofurantoin Macrocrystal (MACRODANTIN PO), Take by mouth. Prn urinary symptoms, Disp: , Rfl:    No Known Allergies  Past Medical History:  Diagnosis Date   Frequent headaches    Osteoporosis      Past Surgical History:  Procedure Laterality Date   ABDOMINAL HYSTERECTOMY     CESAREAN SECTION      History reviewed. No pertinent family history.  Social History   Tobacco Use   Smoking status: Never   Smokeless tobacco: Never  Vaping Use   Vaping Use: Never used  Substance Use Topics   Alcohol use: No   Drug use: Never    ROS   Objective:   Vitals: BP 116/65 (BP Location: Right Arm)   Pulse 84   Temp 97.9 F (36.6 C) (Oral)   Resp 18   SpO2 98%   Physical Exam Constitutional:      General: She is not in acute distress.    Appearance: Normal appearance. She is  well-developed. She is not ill-appearing, toxic-appearing or diaphoretic.  HENT:     Head: Normocephalic and atraumatic.     Right Ear: External ear normal.     Left Ear: External ear normal.     Nose: Nose normal.     Mouth/Throat:     Mouth: Mucous membranes are moist.     Pharynx: Oropharynx is clear.  Eyes:     General: No scleral icterus.       Right eye: No discharge.        Left eye: No discharge.     Extraocular Movements: Extraocular movements intact.     Conjunctiva/sclera: Conjunctivae normal.     Pupils: Pupils are equal, round, and reactive to light.  Cardiovascular:     Rate and Rhythm: Normal rate.  Pulmonary:     Effort: Pulmonary effort is normal.  Abdominal:     General: Bowel sounds are normal. There is no distension.     Palpations: Abdomen is soft. There is no mass.     Tenderness: There is no abdominal tenderness. There is no right CVA tenderness, left CVA tenderness, guarding or rebound.  Skin:    General: Skin is warm and dry.  Neurological:     General: No focal deficit present.     Mental Status: She is alert and oriented to person,  place, and time.  Psychiatric:        Mood and Affect: Mood normal.        Behavior: Behavior normal.    Results for orders placed or performed during the hospital encounter of 07/06/21 (from the past 24 hour(s))  POCT urinalysis dipstick     Status: Abnormal   Collection Time: 07/06/21  1:19 PM  Result Value Ref Range   Color, UA yellow yellow   Clarity, UA clear clear   Glucose, UA negative negative mg/dL   Bilirubin, UA negative negative   Ketones, POC UA negative negative mg/dL   Spec Grav, UA 1.020 1.010 - 1.025   Blood, UA large (A) negative   pH, UA 5.0 5.0 - 8.0   Protein Ur, POC trace (A) negative mg/dL   Urobilinogen, UA 0.2 0.2 or 1.0 E.U./dL   Nitrite, UA Negative Negative   Leukocytes, UA Negative Negative    Assessment and Plan :   PDMP not reviewed this encounter.  1. Urinary frequency   2.  Dysuria   3. History of UTI    Emphasized need to cut back on her coffee which she has been advised to do in the past.  For now, urinalysis does not support urinary tract infection.  Hydrate much better with water.  Urine culture pending, will prescribe antibiotics as appropriate. Counseled patient on potential for adverse effects with medications prescribed/recommended today, ER and return-to-clinic precautions discussed, patient verbalized understanding.    Jaynee Eagles, PA-C 07/06/21 1326

## 2021-07-07 LAB — URINE CULTURE: Culture: <10000 — AB

## 2021-07-13 ENCOUNTER — Telehealth: Payer: Self-pay | Admitting: Emergency Medicine

## 2021-07-13 NOTE — Telephone Encounter (Signed)
This writer attempted to return patients phone call with no answer.

## 2021-07-19 DIAGNOSIS — Z23 Encounter for immunization: Secondary | ICD-10-CM | POA: Diagnosis not present

## 2021-10-23 ENCOUNTER — Other Ambulatory Visit: Payer: Self-pay

## 2021-10-23 DIAGNOSIS — I739 Peripheral vascular disease, unspecified: Secondary | ICD-10-CM

## 2021-10-26 ENCOUNTER — Ambulatory Visit (HOSPITAL_COMMUNITY)
Admission: RE | Admit: 2021-10-26 | Discharge: 2021-10-26 | Disposition: A | Discharge status: Home / Self Care | Payer: Medicare Other | Source: Ambulatory Visit | Attending: Vascular Surgery | Admitting: Vascular Surgery

## 2021-10-26 ENCOUNTER — Ambulatory Visit: Payer: Medicare Other | Admitting: Vascular Surgery

## 2021-10-26 ENCOUNTER — Other Ambulatory Visit: Payer: Self-pay

## 2021-10-26 DIAGNOSIS — I739 Peripheral vascular disease, unspecified: Secondary | ICD-10-CM | POA: Diagnosis not present

## 2021-11-09 ENCOUNTER — Ambulatory Visit (INDEPENDENT_AMBULATORY_CARE_PROVIDER_SITE_OTHER): Payer: Medicare Other | Admitting: Vascular Surgery

## 2021-11-09 ENCOUNTER — Encounter: Payer: Self-pay | Admitting: Vascular Surgery

## 2021-11-09 ENCOUNTER — Other Ambulatory Visit: Payer: Self-pay

## 2021-11-09 VITALS — BP 112/67 | HR 87 | Temp 98.2°F | Resp 20 | Ht 59.0 in | Wt 98.4 lb

## 2021-11-09 DIAGNOSIS — I739 Peripheral vascular disease, unspecified: Secondary | ICD-10-CM | POA: Diagnosis not present

## 2021-11-09 NOTE — Progress Notes (Signed)
? ? ?REASON FOR VISIT:  ? ?Follow-up of peripheral arterial disease. ? ?MEDICAL ISSUES:  ? ?PERIPHERAL ARTERIAL DISEASE: This patient has stable left lower extremity claudication.  Her symptoms have not changed in the last year.  She is highly motivated and cuts her own grass.  She has over an acre.  She is on aspirin and is not a smoker.  I think it is very unlikely that she would ever require any type of vascular intervention and therefore I will plan on seeing her back as needed.  She knows to call if her symptoms progress or she develops rest pain or a wound. ? ?HPI:  ? ?Regina Moody is a pleasant 81 y.o. female who I last saw on 10/26/2020 with peripheral arterial disease.  She had evidence of infrainguinal arterial occlusive disease on the left.  Her left lower extremity claudication symptoms were stable.  Her ABI had actually improved from 39% to 49%.  I encouraged her to stay as active as possible.  She was not a smoker.  She was on aspirin.  I thought we could stretch her follow-up out to 1 year and she comes in for a 1 year follow-up visit. ? ?Today she does continue to have some left calf claudication but states that she can "walk a long way" before experiencing symptoms.  She is more likely to have claudication of the left calf as she is walking uphill.  She cuts her own grass and this is over an acre.  It takes her 2 to 3 hours to do this.  She denies any history of rest pain or nonhealing ulcers. ? ?She is on aspirin.  She does not take a statin. ? ?Past Medical History:  ?Diagnosis Date  ? Frequent headaches   ? Osteoporosis   ? ? ?History reviewed. No pertinent family history. ? ?SOCIAL HISTORY: ?Social History  ? ?Tobacco Use  ? Smoking status: Never  ? Smokeless tobacco: Never  ?Substance Use Topics  ? Alcohol use: No  ? ? ?No Known Allergies ? ?Current Outpatient Medications  ?Medication Sig Dispense Refill  ? alendronate (FOSAMAX) 70 MG tablet Take 70 mg by mouth once a week.    ? aspirin EC  81 MG tablet Take 81 mg by mouth daily. Swallow whole.    ? butalbital-acetaminophen-caffeine (FIORICET) 50-325-40 MG tablet Take by mouth 3 (three) times daily as needed for headache.    ? cyclobenzaprine (FLEXERIL) 10 MG tablet Take 1 tablet (10 mg total) by mouth at bedtime as needed for muscle spasms. 30 tablet 3  ? ibuprofen (ADVIL) 200 MG tablet Take 200 mg by mouth every 6 (six) hours as needed for mild pain.    ? Nitrofurantoin Macrocrystal (MACRODANTIN PO) Take by mouth. Prn urinary symptoms    ? phenazopyridine (PYRIDIUM) 200 MG tablet Take 1 tablet (200 mg total) by mouth 3 (three) times daily as needed for pain. (Patient not taking: Reported on 11/09/2021) 10 tablet 0  ? ?No current facility-administered medications for this visit.  ? ? ?REVIEW OF SYSTEMS:  ?'[X]'$  denotes positive finding, '[ ]'$  denotes negative finding ?Cardiac  Comments:  ?Chest pain or chest pressure:    ?Shortness of breath upon exertion:    ?Short of breath when lying flat:    ?Irregular heart rhythm:    ?    ?Vascular    ?Pain in calf, thigh, or hip brought on by ambulation: x   ?Pain in feet at night that wakes you up from  your sleep:     ?Blood clot in your veins:    ?Leg swelling:     ?    ?Pulmonary    ?Oxygen at home:    ?Productive cough:     ?Wheezing:     ?    ?Neurologic    ?Sudden weakness in arms or legs:     ?Sudden numbness in arms or legs:     ?Sudden onset of difficulty speaking or slurred speech:    ?Temporary loss of vision in one eye:     ?Problems with dizziness:     ?    ?Gastrointestinal    ?Blood in stool:     ?Vomited blood:     ?    ?Genitourinary    ?Burning when urinating:     ?Blood in urine:    ?    ?Psychiatric    ?Major depression:     ?    ?Hematologic    ?Bleeding problems:    ?Problems with blood clotting too easily:    ?    ?Skin    ?Rashes or ulcers:    ?    ?Constitutional    ?Fever or chills:    ? ?PHYSICAL EXAM:  ? ?Vitals:  ? 11/09/21 1522  ?BP: 112/67  ?Pulse: 87  ?Resp: 20  ?Temp: 98.2 ?F (36.8  ?C)  ?SpO2: 98%  ?Weight: 98 lb 6.4 oz (44.6 kg)  ?Height: '4\' 11"'$  (1.499 m)  ? ? ?GENERAL: The patient is a well-nourished female, in no acute distress. The vital signs are documented above. ?CARDIAC: There is a regular rate and rhythm.  ?VASCULAR: I do not detect carotid bruits. ?She has palpable femoral pulses. ?On the right side she has a palpable dorsalis pedis pulse. ?I cannot palpate pulses on the left.  The left foot is warm and well-perfused. ?She has no more lower extremity swelling. ?PULMONARY: There is good air exchange bilaterally without wheezing or rales. ?ABDOMEN: Soft and non-tender with normal pitched bowel sounds.  I do not palpate an aneurysm ?MUSCULOSKELETAL: There are no major deformities or cyanosis. ?NEUROLOGIC: No focal weakness or paresthesias are detected. ?SKIN: There are no ulcers or rashes noted. ?PSYCHIATRIC: The patient has a normal affect. ? ?DATA:   ? ?ARTERIAL DOPPLER STUDY: I have independently interpreted her arterial Doppler study that was done on 10/27/2021. ? ?On the right side there was a biphasic dorsalis pedis signal with a triphasic posterior tibial signal.  ABI was 100%.  Toe pressure was 56 mmHg. ? ?On the left side, which is the symptomatic side, there was a monophasic dorsalis pedis and posterior tibial signal.  ABI was 47%.  Toe pressure was 31 mmHg. ? ?Deitra Mayo ?[facility] ?Office [phone] ?

## 2022-01-08 ENCOUNTER — Other Ambulatory Visit: Payer: Self-pay | Admitting: Internal Medicine

## 2022-01-08 DIAGNOSIS — Z1231 Encounter for screening mammogram for malignant neoplasm of breast: Secondary | ICD-10-CM

## 2022-01-16 ENCOUNTER — Ambulatory Visit
Admission: RE | Admit: 2022-01-16 | Discharge: 2022-01-16 | Disposition: A | Discharge status: Home / Self Care | Payer: Medicare Other | Source: Ambulatory Visit | Attending: Internal Medicine | Admitting: Internal Medicine

## 2022-01-16 DIAGNOSIS — Z1231 Encounter for screening mammogram for malignant neoplasm of breast: Secondary | ICD-10-CM | POA: Diagnosis not present

## 2022-02-14 DIAGNOSIS — L570 Actinic keratosis: Secondary | ICD-10-CM | POA: Diagnosis not present

## 2022-02-16 ENCOUNTER — Other Ambulatory Visit: Payer: Self-pay

## 2022-02-16 ENCOUNTER — Emergency Department (HOSPITAL_COMMUNITY)
Admission: EM | Admit: 2022-02-16 | Discharge: 2022-02-16 | Disposition: A | Discharge status: Home / Self Care | Payer: Medicare Other | Attending: Emergency Medicine | Admitting: Emergency Medicine

## 2022-02-16 ENCOUNTER — Encounter (HOSPITAL_COMMUNITY): Payer: Self-pay | Admitting: Emergency Medicine

## 2022-02-16 ENCOUNTER — Emergency Department (HOSPITAL_COMMUNITY): Payer: Medicare Other

## 2022-02-16 DIAGNOSIS — J984 Other disorders of lung: Secondary | ICD-10-CM | POA: Diagnosis not present

## 2022-02-16 DIAGNOSIS — I700 Atherosclerosis of aorta: Secondary | ICD-10-CM | POA: Insufficient documentation

## 2022-02-16 DIAGNOSIS — Z9889 Other specified postprocedural states: Secondary | ICD-10-CM | POA: Diagnosis not present

## 2022-02-16 DIAGNOSIS — Z7982 Long term (current) use of aspirin: Secondary | ICD-10-CM | POA: Diagnosis not present

## 2022-02-16 DIAGNOSIS — S3991XA Unspecified injury of abdomen, initial encounter: Secondary | ICD-10-CM | POA: Diagnosis not present

## 2022-02-16 DIAGNOSIS — R109 Unspecified abdominal pain: Secondary | ICD-10-CM | POA: Insufficient documentation

## 2022-02-16 DIAGNOSIS — W1830XA Fall on same level, unspecified, initial encounter: Secondary | ICD-10-CM | POA: Insufficient documentation

## 2022-02-16 DIAGNOSIS — S2241XA Multiple fractures of ribs, right side, initial encounter for closed fracture: Secondary | ICD-10-CM | POA: Insufficient documentation

## 2022-02-16 DIAGNOSIS — S20309A Unspecified superficial injuries of unspecified front wall of thorax, initial encounter: Secondary | ICD-10-CM | POA: Diagnosis present

## 2022-02-16 DIAGNOSIS — W19XXXA Unspecified fall, initial encounter: Secondary | ICD-10-CM

## 2022-02-16 DIAGNOSIS — I2510 Atherosclerotic heart disease of native coronary artery without angina pectoris: Secondary | ICD-10-CM | POA: Diagnosis not present

## 2022-02-16 DIAGNOSIS — R918 Other nonspecific abnormal finding of lung field: Secondary | ICD-10-CM | POA: Diagnosis not present

## 2022-02-16 LAB — CBC WITH DIFFERENTIAL/PLATELET
Abs Immature Granulocytes: 0.03 10*3/uL (ref 0.00–0.07)
Basophils Absolute: 0 10*3/uL (ref 0.0–0.1)
Basophils Relative: 1 %
Eosinophils Absolute: 0.1 10*3/uL (ref 0.0–0.5)
Eosinophils Relative: 2 %
HCT: 36.4 % (ref 36.0–46.0)
Hemoglobin: 11.8 g/dL — ABNORMAL LOW (ref 12.0–15.0)
Immature Granulocytes: 0 %
Lymphocytes Relative: 22 %
Lymphs Abs: 1.5 10*3/uL (ref 0.7–4.0)
MCH: 31.1 pg (ref 26.0–34.0)
MCHC: 32.4 g/dL (ref 30.0–36.0)
MCV: 96 fL (ref 80.0–100.0)
Monocytes Absolute: 0.7 10*3/uL (ref 0.1–1.0)
Monocytes Relative: 10 %
Neutro Abs: 4.5 10*3/uL (ref 1.7–7.7)
Neutrophils Relative %: 65 %
Platelets: 200 10*3/uL (ref 150–400)
RBC: 3.79 MIL/uL — ABNORMAL LOW (ref 3.87–5.11)
RDW: 13.6 % (ref 11.5–15.5)
WBC: 6.9 10*3/uL (ref 4.0–10.5)
nRBC: 0 % (ref 0.0–0.2)

## 2022-02-16 LAB — COMPREHENSIVE METABOLIC PANEL
ALT: 20 U/L (ref 0–44)
AST: 25 U/L (ref 15–41)
Albumin: 3.7 g/dL (ref 3.5–5.0)
Alkaline Phosphatase: 76 U/L (ref 38–126)
Anion gap: 7 (ref 5–15)
BUN: 21 mg/dL (ref 8–23)
CO2: 30 mmol/L (ref 22–32)
Calcium: 9.4 mg/dL (ref 8.9–10.3)
Chloride: 103 mmol/L (ref 98–111)
Creatinine, Ser: 0.73 mg/dL (ref 0.44–1.00)
GFR, Estimated: >60 mL/min (ref >60)
Glucose, Bld: 88 mg/dL (ref 70–99)
Potassium: 4.4 mmol/L (ref 3.5–5.1)
Sodium: 140 mmol/L (ref 135–145)
Total Bilirubin: 0.7 mg/dL (ref 0.3–1.2)
Total Protein: 7 g/dL (ref 6.5–8.1)

## 2022-02-16 MED ORDER — TRAMADOL HCL 50 MG PO TABS: 50.0000 mg | ORAL_TABLET | Freq: Four times a day (QID) | ORAL | 0 refills | Status: DC | PRN

## 2022-02-16 NOTE — ED Provider Notes (Signed)
[facility] EMERGENCY DEPARTMENT Provider Note   CSN: [id number] Arrival date & time: 02/16/22  1221     History {Add pertinent medical, surgical, social history, OB history to HPI:1} Chief Complaint  Patient presents with   Regina Moody is a 81 y.o. female.   Fall Associated symptoms include chest pain (Lateral right chest wall pain) and abdominal pain. Pertinent negatives include no headaches and no shortness of breath.       Regina Moody is a 81 y.o. female who presents to the Emergency Department complaining of right lateral chest wall pain.  She reports mechanical fall 2 days ago in which she fell on a wooden plank.  She states that she lost her footing and fell on her right side.  She reports immediate pain along her lateral chest wall and upper abdomen that has remained constant.  Pain has not worsened since onset.  She has noted bruising to the area the following day.  She is taking over-the-counter Advil with some relief, but pain worsens with certain movements, coughing, sneezing, deep breathing.  She denies any shortness of breath, nausea, vomiting, hematuria, bowel or urine changes.  She also denies any head injury, neck pain, and LOC.  She does not take anticoagulants.  Home Medications Prior to Admission medications   Medication Sig Start Date End Date Taking? Authorizing Provider  alendronate (FOSAMAX) 70 MG tablet Take 70 mg by mouth once a week. 03/28/20   [provider]  aspirin EC 81 MG tablet Take 81 mg by mouth daily. Swallow whole.    [provider]  butalbital-acetaminophen-caffeine (FIORICET) 50-325-40 MG tablet Take by mouth 3 (three) times daily as needed for headache.    [provider]  cyclobenzaprine (FLEXERIL) 10 MG tablet Take 1 tablet (10 mg total) by mouth at bedtime as needed for muscle spasms. 08/11/19   Star Age, MD  ibuprofen (ADVIL) 200 MG tablet Take 200 mg by mouth every 6 (six) hours as needed for  mild pain.    [provider]  Nitrofurantoin Macrocrystal (MACRODANTIN PO) Take by mouth. Prn urinary symptoms    [provider]  phenazopyridine (PYRIDIUM) 200 MG tablet Take 1 tablet (200 mg total) by mouth 3 (three) times daily as needed for pain. Patient not taking: Reported on 11/09/2021 07/06/21   Jaynee Eagles, PA-C      Allergies    Patient has no known allergies.    Review of Systems   Review of Systems  Constitutional:  Negative for chills and fever.  Eyes:  Negative for visual disturbance.  Respiratory:  Negative for cough, chest tightness and shortness of breath.   Cardiovascular:  Positive for chest pain (Lateral right chest wall pain).  Gastrointestinal:  Positive for abdominal pain. Negative for blood in stool, constipation, diarrhea, nausea and vomiting.  Musculoskeletal:  Negative for back pain and neck pain.  Neurological:  Negative for dizziness, syncope, weakness and headaches.    Physical Exam Updated Vital Signs BP (!) 121/51 (BP Location: Right Arm)   Pulse 85   Temp 97.7 F (36.5 C) (Oral)   Resp 18   Ht '4\' 11"'$  (1.499 m)   Wt 45.8 kg   SpO2 99%   BMI 20.40 kg/m  Physical Exam Vitals and nursing note reviewed.  Constitutional:      General: She is not in acute distress.    Appearance: Normal appearance. She is not toxic-appearing.  HENT:     Head: Atraumatic.  Eyes:     Extraocular Movements: Extraocular movements intact.     Conjunctiva/sclera: Conjunctivae normal.     Pupils: Pupils are equal, round, and reactive to light.  Cardiovascular:     Rate and Rhythm: Normal rate and regular rhythm.     Pulses: Normal pulses.  Pulmonary:     Effort: Pulmonary effort is normal. No respiratory distress.     Breath sounds: Normal breath sounds. No wheezing.     Comments: Tenderness to palpation along the base of the right anterior lateral ribs.  No bony deformities or crepitus.  Ecchymosis present laterally.  No edema or open wound Chest:      Chest wall: Tenderness present.  Abdominal:     General: There is no distension.     Palpations: Abdomen is soft.     Tenderness: There is abdominal tenderness. There is no right CVA tenderness, left CVA tenderness or guarding.     Comments: Mild tenderness of the right upper abdomen.  There is ecchymosis present along the lateral lower right ribs.  No bony deformity or crepitus.  No abdominal guarding or rebound tenderness.  Musculoskeletal:        General: No swelling, tenderness or deformity. Normal range of motion.     Cervical back: Normal range of motion. No tenderness.     Comments: 2 cm abrasion to the lateral right ankle.  No surrounding erythema, laceration, or edema.  Skin:    General: Skin is warm.     Capillary Refill: Capillary refill takes less than 2 seconds.  Neurological:     General: No focal deficit present.     Mental Status: She is alert and oriented to person, place, and time.     Sensory: No sensory deficit.     Motor: No weakness.     ED Results / Procedures / Treatments   Labs (all labs ordered are listed, but only abnormal results are displayed) Labs Reviewed  CBC WITH DIFFERENTIAL/PLATELET - Abnormal; Notable for the following components:      Result Value   RBC 3.79 (*)    Hemoglobin 11.8 (*)    All other components within normal limits  COMPREHENSIVE METABOLIC PANEL    EKG None  Radiology CT CHEST ABDOMEN PELVIS WO CONTRAST  Result Date: 02/16/2022 CLINICAL DATA:  Fall.  Complains of right-sided pain. EXAM: CT CHEST, ABDOMEN AND PELVIS WITHOUT CONTRAST TECHNIQUE: Multidetector CT imaging of the chest, abdomen and pelvis was performed following the standard protocol without IV contrast. RADIATION DOSE REDUCTION: This exam was performed according to the departmental dose-optimization program which includes automated exposure control, adjustment of the mA and/or kV according to patient size and/or use of iterative reconstruction technique.  COMPARISON:  None Available. FINDINGS: CT CHEST FINDINGS Cardiovascular: Normal caliber of the thoracic aorta. Coronary artery calcifications. Heart size is normal. No significant pericardial effusion. Mediastinum/Nodes: Visualized thyroid tissue is unremarkable. No mediastinal hematoma. No significant lymph node enlargement in the mediastinum, hila or axillary regions. Lungs/Pleura: Negative for pneumothorax. Several small nodules scattered throughout both lungs. Largest nodule measures 6 x 4 mm with a mean diameter of 5 mm at the left lung base on sequence 2 image 122. Bandlike densities along the anterior aspect of the right middle lobe are suggestive for areas of scarring. Similarly, there are patchy and bandlike densities in the posterior right lower lobe there are most compatible with scarring and/or atelectasis. Mild pleural thickening or scarring along the posterior right lower lobe. Musculoskeletal: Mildly displaced fractures  involving the lateral right eighth and ninth ribs. These are concerning for acute fractures based on the mild soft tissue thickening in the intercostal tissues and mild subcutaneous edema in this area. Negative for pneumothorax. Thoracic vertebral bodies are intact. Sternum is intact. Both shoulders are located. CT ABDOMEN PELVIS FINDINGS Hepatobiliary: Normal appearance of the liver and gallbladder. Pancreas: Unremarkable. No pancreatic ductal dilatation or surrounding inflammatory changes. Spleen: Normal in size without focal abnormality. Adrenals/Urinary Tract: Normal adrenal glands. Normal appearance of both kidneys without stones or hydronephrosis. Normal urinary bladder. Stomach/Bowel: Stomach is within normal limits. No evidence of bowel wall thickening, distention, or inflammatory changes. Vascular/Lymphatic: Aortic atherosclerosis. No enlarged abdominal or pelvic lymph nodes. Reproductive: Status post hysterectomy. No adnexal masses. Other: Negative for ascites. Negative for  free air. Postsurgical changes along the anterior abdominal wall. Musculoskeletal: No acute bone abnormality. IMPRESSION: 1. Mildly displaced fractures involving the right eighth and ninth ribs. These are likely acute fractures based on the mild soft tissue swelling in this area. 2. Negative for pneumothorax. 3. Patchy and bandlike densities at the right lung base and right middle lobe. Findings could represent a combination atelectasis and scarring. 4. Multiple small nodular densities scattered throughout the lungs. These nodules are indeterminate and could be postinflammatory. Largest nodule has a mean diameter of 5 mm. No follow-up needed if patient is low-risk (and has no known or suspected primary neoplasm). Non-contrast chest CT can be considered in 12 months if patient is high-risk. This recommendation follows the consensus statement: Guidelines for Management of Incidental Pulmonary Nodules Detected on CT Images: From the Fleischner Society 2017; Radiology 2017; 284:228-243. 5.  Aortic Atherosclerosis (ICD10-I70.0). 6. No acute abnormality in the abdomen or pelvis. Electronically Signed   By: Markus Daft M.D.   On: 02/16/2022 14:23    Procedures Procedures  {Document cardiac monitor, telemetry assessment procedure when appropriate:1}  Medications Ordered in ED Medications - No data to display  ED Course/ Medical Decision Making/ A&P                           Medical Decision Making Patient here for evaluation of mechanical fall 2 days ago which she injured right lateral chest and right upper abdomen.  No head injury or LOC.  No neck or back pain.  On exam, patient well-appearing nontoxic.  Vital signs reassuring.  She does have tenderness of the right upper abdomen and lower right ribs.  There is bruising noted at the base of the ribs.  I suspect rib injury.  Differential would also include intra-abdominal injury, pneumothorax.  Will obtain imaging for further evaluation  Amount and/or  Complexity of Data Reviewed Labs: ordered. Radiology: ordered.  Risk Prescription drug management.     {Document critical care time when appropriate:1} {Document review of labs and clinical decision tools ie heart score, Chads2Vasc2 etc:1}  {Document your independent review of radiology images, and any outside records:1} {Document your discussion with family members, caretakers, and with consultants:1} {Document social determinants of health affecting pt's care:1} {Document your decision making why or why not admission, treatments were needed:1} Final Clinical Impression(s) / ED Diagnoses Final diagnoses:  None    Rx / DC Orders ED Discharge Orders     None

## 2022-02-16 NOTE — Discharge Instructions (Signed)
Your CT scan today shows that you have 2 broken ribs on the right side.  Broken ribs can cause significant pain.  As discussed, recommend that you use a small pillow held to your side to cough, sneeze, and also to take deep breaths several times a day.  Pain medication as directed if needed.  Please follow-up with your primary care provider for recheck.  Return emergency department if you develop any worsening symptoms such as increased pain or sudden shortness of breath.

## 2022-02-16 NOTE — ED Triage Notes (Signed)
Pt to triage with c/o fall on Wednesday after loosing balance falling into the brick floor.  Pt c/o pain to right side, pain increases with deep inspiration.

## 2022-03-12 ENCOUNTER — Other Ambulatory Visit: Payer: Self-pay | Admitting: Internal Medicine

## 2022-03-12 DIAGNOSIS — M810 Age-related osteoporosis without current pathological fracture: Secondary | ICD-10-CM | POA: Diagnosis not present

## 2022-03-12 DIAGNOSIS — Z0000 Encounter for general adult medical examination without abnormal findings: Secondary | ICD-10-CM | POA: Diagnosis not present

## 2022-03-12 DIAGNOSIS — Z1331 Encounter for screening for depression: Secondary | ICD-10-CM | POA: Diagnosis not present

## 2022-03-12 DIAGNOSIS — J309 Allergic rhinitis, unspecified: Secondary | ICD-10-CM | POA: Diagnosis not present

## 2022-03-12 DIAGNOSIS — I700 Atherosclerosis of aorta: Secondary | ICD-10-CM | POA: Diagnosis not present

## 2022-03-12 DIAGNOSIS — G43909 Migraine, unspecified, not intractable, without status migrainosus: Secondary | ICD-10-CM | POA: Diagnosis not present

## 2022-03-12 DIAGNOSIS — I739 Peripheral vascular disease, unspecified: Secondary | ICD-10-CM | POA: Diagnosis not present

## 2022-03-12 DIAGNOSIS — S2239XA Fracture of one rib, unspecified side, initial encounter for closed fracture: Secondary | ICD-10-CM | POA: Diagnosis not present

## 2022-03-12 DIAGNOSIS — E7800 Pure hypercholesterolemia, unspecified: Secondary | ICD-10-CM | POA: Diagnosis not present

## 2022-04-24 DIAGNOSIS — H5203 Hypermetropia, bilateral: Secondary | ICD-10-CM | POA: Diagnosis not present

## 2022-04-24 DIAGNOSIS — Z961 Presence of intraocular lens: Secondary | ICD-10-CM | POA: Diagnosis not present

## 2022-04-24 DIAGNOSIS — D3132 Benign neoplasm of left choroid: Secondary | ICD-10-CM | POA: Diagnosis not present

## 2022-07-18 DIAGNOSIS — Z23 Encounter for immunization: Secondary | ICD-10-CM | POA: Diagnosis not present

## 2022-08-21 ENCOUNTER — Inpatient Hospital Stay: Admission: RE | Admit: 2022-08-21 | Payer: Medicare Other | Source: Ambulatory Visit

## 2022-08-30 ENCOUNTER — Encounter: Payer: Self-pay | Admitting: Podiatry

## 2022-08-30 ENCOUNTER — Ambulatory Visit (INDEPENDENT_AMBULATORY_CARE_PROVIDER_SITE_OTHER): Payer: Medicare Other | Admitting: Podiatry

## 2022-08-30 VITALS — BP 130/51 | HR 74

## 2022-08-30 DIAGNOSIS — R234 Changes in skin texture: Secondary | ICD-10-CM

## 2022-08-30 MED ORDER — MUPIROCIN 2 % EX OINT
1.0000 | TOPICAL_OINTMENT | Freq: Two times a day (BID) | CUTANEOUS | 0 refills | Status: DC
Start: 2022-08-30 — End: 2022-10-24

## 2022-08-30 MED ORDER — CEPHALEXIN 500 MG PO CAPS: 500.0000 mg | ORAL_CAPSULE | Freq: Three times a day (TID) | ORAL | 0 refills | Status: DC

## 2022-08-30 NOTE — Progress Notes (Signed)
Subjective:  Patient ID: Regina Moody, female    DOB: 12/10/1940,  MRN: [id number] HPI Chief Complaint  Patient presents with   Foot Pain    Plantar forefoot (sub 4th/5th MPJ) left - darkened spot of 6 months, tender walking, walks a lot for exercise and has been unable to, also wound that appears to have been bleeding, soaking in epsom-no help   New Patient (Initial Visit)    81 y.o. female presents with the above complaint.   ROS: Denies fever chills nausea vomit muscle aches pains calf pain back pain chest pain shortness of breath.  Past Medical History:  Diagnosis Date   Frequent headaches    Osteoporosis    Past Surgical History:  Procedure Laterality Date   ABDOMINAL HYSTERECTOMY     CESAREAN SECTION      Current Outpatient Medications:    cephALEXin (KEFLEX) 500 MG capsule, Take 1 capsule (500 mg total) by mouth 3 (three) times daily., Disp: 30 capsule, Rfl: 0   mupirocin ointment (BACTROBAN) 2 %, Apply 1 Application topically 2 (two) times daily., Disp: 22 g, Rfl: 0   alendronate (FOSAMAX) 70 MG tablet, Take 70 mg by mouth once a week., Disp: , Rfl:    aspirin EC 81 MG tablet, Take 81 mg by mouth daily. Swallow whole., Disp: , Rfl:    butalbital-acetaminophen-caffeine (FIORICET) 50-325-40 MG tablet, Take by mouth 3 (three) times daily as needed for headache., Disp: , Rfl:    cyclobenzaprine (FLEXERIL) 10 MG tablet, Take 1 tablet (10 mg total) by mouth at bedtime as needed for muscle spasms., Disp: 30 tablet, Rfl: 3   ibuprofen (ADVIL) 200 MG tablet, Take 200 mg by mouth every 6 (six) hours as needed for mild pain., Disp: , Rfl:    Nitrofurantoin Macrocrystal (MACRODANTIN PO), Take by mouth. Prn urinary symptoms, Disp: , Rfl:    phenazopyridine (PYRIDIUM) 200 MG tablet, Take 1 tablet (200 mg total) by mouth 3 (three) times daily as needed for pain. (Patient not taking: Reported on 11/09/2021), Disp: 10 tablet, Rfl: 0   traMADol (ULTRAM) 50 MG tablet, Take 1 tablet (50 mg  total) by mouth every 6 (six) hours as needed., Disp: 12 tablet, Rfl: 0  No Known Allergies Review of Systems Objective:   Vitals:   08/30/22 1437  BP: (!) 130/51  Pulse: 74    General: Well developed, nourished, in no acute distress, alert and oriented x3   Dermatological: Skin is warm, dry and supple bilateral. Nails x 10 are well maintained; remaining integument appears unremarkable at this time. There are no open sores, no preulcerative lesions, no rash or signs of infection present.  Flat nevus to the plantar aspect of the left forefoot.  She also has a fissure measuring about a centimeter in length with mild surrounding erythema no purulence no malodor but tenderness.  Vascular: Dorsalis Pedis artery and Posterior Tibial artery pedal pulses are 2/4 bilateral with immedate capillary fill time. Pedal hair growth present. No varicosities and no lower extremity edema present bilateral.   Neruologic: Grossly intact via light touch bilateral. Vibratory intact via tuning fork bilateral. Protective threshold with Semmes Wienstein monofilament intact to all pedal sites bilateral. Patellar and Achilles deep tendon reflexes 2+ bilateral. No Babinski or clonus noted bilateral.   Musculoskeletal: No gross boney pedal deformities bilateral. No pain, crepitus, or limitation noted with foot and ankle range of motion bilateral. Muscular strength 5/5 in all groups tested bilateral.  Gait: Unassisted, Nonantalgic.    Radiographs:  None taken   Assessment & Plan:   Assessment: Skin fissure plantar aspect left foot mild cellulitic process   Plan: Started her on Bactroban ointment and Keflex today discussed appropriate shoe gear would like to follow-up with her in 2 to 3 weeks to make sure she is improving.     Dash Cardarelli T. [place], [place]

## 2022-09-20 ENCOUNTER — Encounter: Payer: Self-pay | Admitting: Podiatry

## 2022-09-20 ENCOUNTER — Ambulatory Visit (INDEPENDENT_AMBULATORY_CARE_PROVIDER_SITE_OTHER): Payer: PPO | Admitting: Podiatry

## 2022-09-20 VITALS — BP 137/58 | HR 80

## 2022-09-20 DIAGNOSIS — R234 Changes in skin texture: Secondary | ICD-10-CM | POA: Diagnosis not present

## 2022-09-20 DIAGNOSIS — R0989 Other specified symptoms and signs involving the circulatory and respiratory systems: Secondary | ICD-10-CM | POA: Diagnosis not present

## 2022-09-20 NOTE — Progress Notes (Signed)
She presents today for follow-up of her skin fissures subfifth left.  States that is better but it still hurts when the bump and walk on it are in millimeters in bed.  Objective: Vital signs stable alert oriented x 3 the foot does look better as far as the erythema and there is does not appear to be any cellulitic process any longer.  However just sitting in the exam room chair her toes are cyanotic and her pulses to this left foot at this time are nonpalpable.  The right foot is still palpable with minimal cyanosis.  Assessment: Chronic wound to rule out peripheral vascular disease at this point.  Plan: Sending her for ABIs and vascular evaluation

## 2022-09-24 ENCOUNTER — Ambulatory Visit (HOSPITAL_COMMUNITY)
Admission: RE | Admit: 2022-09-24 | Discharge: 2022-09-24 | Disposition: A | Discharge status: Home / Self Care | Payer: PPO | Source: Ambulatory Visit | Attending: Podiatry | Admitting: Podiatry

## 2022-09-24 DIAGNOSIS — R0989 Other specified symptoms and signs involving the circulatory and respiratory systems: Secondary | ICD-10-CM

## 2022-09-24 LAB — VAS US ABI WITH/WO TBI: Left ABI: 0.6

## 2022-09-25 ENCOUNTER — Telehealth: Payer: Self-pay | Admitting: *Deleted

## 2022-09-25 NOTE — Telephone Encounter (Signed)
Patient is calling for [place] test results, can see them on MyChart, unable to read, please advise.

## 2022-10-02 ENCOUNTER — Ambulatory Visit (INDEPENDENT_AMBULATORY_CARE_PROVIDER_SITE_OTHER): Payer: PPO | Admitting: Podiatry

## 2022-10-02 DIAGNOSIS — R234 Changes in skin texture: Secondary | ICD-10-CM | POA: Diagnosis not present

## 2022-10-02 NOTE — Progress Notes (Signed)
She presents today for follow-up of her nonhealing wound to the plantar aspect of her left foot with some splotchy macular discoloration.  She has been to vein and vascular and she states that they said she was okay.  Objective: Vitals are stable she alert and oriented x 3 pulses are nonpalpable ABIs demonstrate hardening of the arteries peripheral vascular disease with decreased ABIs below the healing level however the wound is healing today and looks very good there is no open wound at all and her macules are starting to lighten up.  Assessment: Peripheral vascular disease bilateral lower extremity well-healing wounds to mobic left foot.  Plan: Follow-up with me on an as-needed basis.  She is to continue her aspirin daily.

## 2022-10-02 NOTE — Telephone Encounter (Signed)
Patient has been scheduled today @ 1:15

## 2022-10-09 DIAGNOSIS — L814 Other melanin hyperpigmentation: Secondary | ICD-10-CM | POA: Diagnosis not present

## 2022-10-09 DIAGNOSIS — L84 Corns and callosities: Secondary | ICD-10-CM | POA: Diagnosis not present

## 2022-10-09 DIAGNOSIS — L0101 Non-bullous impetigo: Secondary | ICD-10-CM | POA: Diagnosis not present

## 2022-10-24 ENCOUNTER — Ambulatory Visit
Admission: RE | Admit: 2022-10-24 | Discharge: 2022-10-24 | Disposition: A | Discharge status: Home / Self Care | Payer: HMO | Source: Ambulatory Visit | Attending: Nurse Practitioner | Admitting: Nurse Practitioner

## 2022-10-24 VITALS — BP 118/71 | HR 92 | Temp 98.0°F | Resp 16

## 2022-10-24 DIAGNOSIS — Z1152 Encounter for screening for COVID-19: Secondary | ICD-10-CM

## 2022-10-24 DIAGNOSIS — J069 Acute upper respiratory infection, unspecified: Secondary | ICD-10-CM | POA: Diagnosis not present

## 2022-10-24 MED ORDER — BENZONATATE 100 MG PO CAPS: 100.0000 mg | ORAL_CAPSULE | Freq: Three times a day (TID) | ORAL | 0 refills | Status: DC | PRN

## 2022-10-24 NOTE — ED Provider Notes (Signed)
[facility]    CSN: [id number] Arrival date & time: 10/24/22  1344      History   Chief Complaint Chief Complaint  Patient presents with   Appointment    1400   Cough    HPI Regina Moody is a 82 y.o. female.   Patient presents today for 3-day history of chills, dry cough, stuffy and runny nose, sore throat, headache and pressure in her head, decreased appetite, and fatigue.  She reports headache is somewhat improved since it began.  She denies shortness of breath or chest pain, ear pain, abdominal pain, nausea/vomiting, diarrhea, and loss of taste or smell.  No known sick contacts.  Has taken Advil and aspirin for symptoms with minimal benefit.    Past Medical History:  Diagnosis Date   Frequent headaches    Osteoporosis     There are no problems to display for this patient.   Past Surgical History:  Procedure Laterality Date   ABDOMINAL HYSTERECTOMY     CESAREAN SECTION      OB History   No obstetric history on file.      Home Medications    Prior to Admission medications   Medication Sig Start Date End Date Taking? Authorizing Provider  benzonatate (TESSALON) 100 MG capsule Take 1 capsule (100 mg total) by mouth 3 (three) times daily as needed for cough. Do not take with alcohol or while driving or operating heavy machinery.  May cause drowsiness. 10/24/22  Yes Eulogio Bear, NP  alendronate (FOSAMAX) 70 MG tablet Take 70 mg by mouth once a week. 03/28/20   [provider]  aspirin EC 81 MG tablet Take 81 mg by mouth daily. Swallow whole.    [provider]  ibuprofen (ADVIL) 200 MG tablet Take 200 mg by mouth every 6 (six) hours as needed for mild pain.    [provider]    Family History History reviewed. No pertinent family history.  Social History Social History   Tobacco Use   Smoking status: Never   Smokeless tobacco: Never  Vaping Use   Vaping Use: Never used  Substance Use Topics   Alcohol  use: No   Drug use: Never     Allergies   Patient has no known allergies.   Review of Systems Review of Systems Per HPI  Physical Exam Triage Vital Signs ED Triage Vitals  Enc Vitals Group     BP 10/24/22 1434 118/71     Pulse Rate 10/24/22 1434 92     Resp 10/24/22 1434 16     Temp 10/24/22 1434 98 F (36.7 C)     Temp Source 10/24/22 1434 Oral     SpO2 10/24/22 1434 98 %     Weight --      Height --      Head Circumference --      Peak Flow --      Pain Score 10/24/22 1431 0     Pain Loc --      Pain Edu? --      Excl. in [place]? --    No data found.  Updated Vital Signs BP 118/71 (BP Location: Right Arm)   Pulse 92   Temp 98 F (36.7 C) (Oral)   Resp 16   SpO2 98%   Visual Acuity Right Eye Distance:   Left Eye Distance:   Bilateral Distance:    Right Eye Near:   Left Eye Near:    Bilateral  Near:     Physical Exam Vitals and nursing note reviewed.  Constitutional:      General: She is not in acute distress.    Appearance: Normal appearance. She is not ill-appearing or toxic-appearing.  HENT:     Head: Normocephalic and atraumatic.     Right Ear: Tympanic membrane, ear canal and external ear normal.     Left Ear: Tympanic membrane, ear canal and external ear normal.     Nose: Rhinorrhea present. No congestion.     Mouth/Throat:     Mouth: Mucous membranes are moist.     Pharynx: Oropharynx is clear. Posterior oropharyngeal erythema present. No oropharyngeal exudate.  Eyes:     General: No scleral icterus.    Extraocular Movements: Extraocular movements intact.  Cardiovascular:     Rate and Rhythm: Normal rate and regular rhythm.  Pulmonary:     Effort: Pulmonary effort is normal. No respiratory distress.     Breath sounds: Normal breath sounds. No wheezing, rhonchi or rales.  Abdominal:     General: Abdomen is flat. Bowel sounds are normal. There is no distension.     Palpations: Abdomen is soft.     Tenderness: There is no abdominal  tenderness. There is no guarding.  Musculoskeletal:     Cervical back: Normal range of motion and neck supple.  Lymphadenopathy:     Cervical: No cervical adenopathy.  Skin:    General: Skin is warm and dry.     Coloration: Skin is not jaundiced or pale.     Findings: No erythema or rash.  Neurological:     Mental Status: She is alert and oriented to person, place, and time.  Psychiatric:        Behavior: Behavior is cooperative.      UC Treatments / Results  Labs (all labs ordered are listed, but only abnormal results are displayed) Labs Reviewed  SARS CORONAVIRUS 2 (TAT 6-24 HRS)    EKG   Radiology No results found.  Procedures Procedures (including critical care time)  Medications Ordered in UC Medications - No data to display  Initial Impression / Assessment and Plan / UC Course  I have reviewed the triage vital signs and the nursing notes.  Pertinent labs & imaging results that were available during my care of the patient were reviewed by me and considered in my medical decision making (see chart for details).   Patient is well-appearing, normotensive, afebrile, not tachycardic, not tachypneic, oxygenating well on room air.    1. Encounter for screening for COVID-19 2. Viral URI with cough Suspect viral etiology Examination vital signs today are reassuring COVID-19 testing obtained Patient is a candidate for Paxlovid if she test positive; last GFR greater than 60 Other supportive care discussed including Tessalon Perles ER and return precautions discussed with patient  The patient was given the opportunity to ask questions.  All questions answered to their satisfaction.  The patient is in agreement to this plan.    Final Clinical Impressions(s) / UC Diagnoses   Final diagnoses:  Encounter for screening for COVID-19  Viral URI with cough     Discharge Instructions      You have a viral upper respiratory infection.  Symptoms should improve over the  next week to 10 days.  If you develop chest pain or shortness of breath, go to the emergency room.  We have tested you today for COVID-19.  You will see the results in Mychart and we will call you  with positive results.  Please stay home and isolate until you are aware of the results.    Some things that can make you feel better are: - Increased rest - Increasing fluid with water/sugar free electrolytes - Acetaminophen and ibuprofen as needed for fever/pain - Salt water gargling, chloraseptic spray and throat lozenges for sore throat - OTC guaifenesin (Mucinex) 600 mg twice daily for congestion - Sudafed  - Saline sinus flushes or a neti pot - Humidifying the air -Tessalon Perles during the day as needed for dry cough     ED Prescriptions     Medication Sig Dispense Auth. Provider   benzonatate (TESSALON) 100 MG capsule Take 1 capsule (100 mg total) by mouth 3 (three) times daily as needed for cough. Do not take with alcohol or while driving or operating heavy machinery.  May cause drowsiness. 21 capsule Eulogio Bear, NP      PDMP not reviewed this encounter.   Eulogio Bear, NP 10/24/22 [phone]

## 2022-10-24 NOTE — Discharge Instructions (Addendum)
You have a viral upper respiratory infection.  Symptoms should improve over the next week to 10 days.  If you develop chest pain or shortness of breath, go to the emergency room.  We have tested you today for COVID-19.  You will see the results in Mychart and we will call you with positive results.  Please stay home and isolate until you are aware of the results.    Some things that can make you feel better are: - Increased rest - Increasing fluid with water/sugar free electrolytes - Acetaminophen and ibuprofen as needed for fever/pain - Salt water gargling, chloraseptic spray and throat lozenges for sore throat - OTC guaifenesin (Mucinex) 600 mg twice daily for congestion - Sudafed  - Saline sinus flushes or a neti pot - Humidifying the air -Tessalon Perles during the day as needed for dry cough

## 2022-10-24 NOTE — ED Triage Notes (Signed)
Pt reports headache, nasal congestion and cough x 1 day. Was no able to sleep due to cough.

## 2022-10-25 LAB — SARS CORONAVIRUS 2 (TAT 6-24 HRS): SARS Coronavirus 2: NEGATIVE

## 2022-11-06 DIAGNOSIS — R0981 Nasal congestion: Secondary | ICD-10-CM | POA: Diagnosis not present

## 2022-11-06 DIAGNOSIS — M79673 Pain in unspecified foot: Secondary | ICD-10-CM | POA: Diagnosis not present

## 2022-11-06 DIAGNOSIS — R8290 Unspecified abnormal findings in urine: Secondary | ICD-10-CM | POA: Diagnosis not present

## 2022-11-27 DIAGNOSIS — D492 Neoplasm of unspecified behavior of bone, soft tissue, and skin: Secondary | ICD-10-CM | POA: Diagnosis not present

## 2022-11-27 DIAGNOSIS — L84 Corns and callosities: Secondary | ICD-10-CM | POA: Diagnosis not present

## 2022-11-27 DIAGNOSIS — D0372 Melanoma in situ of left lower limb, including hip: Secondary | ICD-10-CM | POA: Diagnosis not present

## 2022-11-27 DIAGNOSIS — L0101 Non-bullous impetigo: Secondary | ICD-10-CM | POA: Diagnosis not present

## 2022-12-03 DIAGNOSIS — R3129 Other microscopic hematuria: Secondary | ICD-10-CM | POA: Diagnosis not present

## 2022-12-06 DIAGNOSIS — R208 Other disturbances of skin sensation: Secondary | ICD-10-CM | POA: Diagnosis not present

## 2022-12-06 DIAGNOSIS — D0372 Melanoma in situ of left lower limb, including hip: Secondary | ICD-10-CM | POA: Diagnosis not present

## 2022-12-06 DIAGNOSIS — D492 Neoplasm of unspecified behavior of bone, soft tissue, and skin: Secondary | ICD-10-CM | POA: Diagnosis not present

## 2022-12-25 ENCOUNTER — Other Ambulatory Visit: Payer: Self-pay | Admitting: General Surgery

## 2022-12-25 DIAGNOSIS — C4372 Malignant melanoma of left lower limb, including hip: Secondary | ICD-10-CM | POA: Diagnosis not present

## 2023-01-14 ENCOUNTER — Other Ambulatory Visit: Payer: Self-pay

## 2023-01-14 ENCOUNTER — Encounter (HOSPITAL_COMMUNITY): Payer: Self-pay | Admitting: General Surgery

## 2023-01-14 NOTE — H&P (Signed)
REFERRING PHYSICIAN:  Wynona Canes, MD PROVIDER:  Matthias Hughs, MD MRN: [id number] DOB: 01-Nov-1940 DATE OF ENCOUNTER: 12/25/2022 Subjective    Plan Chief Complaint: NEW CANCER (Melanoma left foot)   History of Present Illness: Regina Moody is a 82 y.o. female who is seen today as an office consultation for evaluation of NEW CANCER (Melanoma left foot)     Patient present with a new diagnosis of malignant melanoma of the left foot April 2024.  The patient started having some pain where she was seen to have a skin lesion.  He was referred to dermatology and a biopsy was performed.  This demonstrated a 2.5 mm melanoma from the left plantar aspect laterally.  There was ulceration but the mitotic index was 0.  Peripheral margins were positive deep margin was negative regression was absent microsatellitosis and LVI were absent perineural invasion was present.  The pathologic stage is at least a pT3b.   Of note, the patient is extremely healthy.  She gets around a lot as well as mows her own yard with a push mower and a riding mower depending on the portion of the yard.  She only takes a baby aspirin as her only medication.   She denies family history of cancer.     Review of Systems: A complete review of systems was obtained from the patient.  I have reviewed this information and discussed as appropriate with the patient.  See HPI as well for other ROS.   Review of Systems  All other systems reviewed and are negative.     Medical History: Past Medical History      Past Medical History:  Diagnosis Date   Arthritis          Problem List     Patient Active Problem List  Diagnosis   Melanoma of foot, left (CMS/HHS-HCC)        Past Surgical History       Past Surgical History:  Procedure Laterality Date   HYSTERECTOMY   1973        Allergies  No Known Allergies     Medications Ordered Prior to Encounter        Current Outpatient Medications on File Prior to  Visit  Medication Sig Dispense Refill   aspirin 81 MG EC tablet Take by mouth        No current facility-administered medications on file prior to visit.        Family History  History reviewed. No pertinent family history.      Tobacco Use History  Social History       Tobacco Use  Smoking Status Never  Smokeless Tobacco Never        Social History  Social History        Socioeconomic History   Marital status: Widowed  Tobacco Use   Smoking status: Never   Smokeless tobacco: Never  Substance and Sexual Activity   Alcohol use: Never   Drug use: Never        Objective:        Vitals:    12/25/22 1341 12/25/22 1345  BP: (!) 167/74    Pulse: 85    Temp: 36.5 C (97.7 F)    SpO2: 99%    Weight: (!) 44.3 kg (97 lb 9.6 oz)    Height: 149.9 cm (4\' 11" )    PainSc:     2  PainLoc:   Foot    Body mass index is 19.71 kg/m.  Head:   Normocephalic and atraumatic.  Mouth/Throat: Oropharynx is clear and moist. No oropharyngeal exudate.  Eyes:   Conjunctivae are normal. Pupils are equal, round, and reactive to light. No scleral icterus.  Neck:   Normal range of motion. Neck supple. No tracheal deviation present. No thyromegaly present.  Cardiovascular: Normal rate, regular rhythm, normal heart sounds and intact distal pulses.  Exam reveals no gallop and no friction rub.   No murmur heard. Respiratory: Effort normal and breath sounds normal. No respiratory distress. No wheezes, rales or rhonchi.  No chest wall tenderness.  GI:       Soft. Bowel sounds are normal. Abdomen is soft, non tender, non distended.  No masses or hepatosplenomegaly is present. There is no rebound and no guarding.  Musculoskeletal: . Extremities are non tender and without deformity.  Lymphadenopathy:   No cervical or axillary adenopathy.  No inguinal or popliteal adenopathy Neurological: Alert and oriented to person, place, and time. Coordination normal.  Skin:    Skin is warm and dry. No  rash noted. No diaphoresis. No erythema. No pallor.  The plantar aspect of the foot at the head of the fifth metatarsal has a biopsy site that is scaling and healing.  This looks like a callus that has been disrupted.  Adjacent to this or several areas of pigmented skin that look like lentigo maligna. Psychiatric: Normal mood and affect.Behavior is normal. Judgment and thought content normal.      Labs, Imaging and Diagnostic Testing: None avail   Assessment and Plan:    Assessment Diagnoses and all orders for this visit:   Melanoma of foot, left (CMS/HHS-HCC)   Patient has a new diagnosis of a pT3b cN0 left foot melanoma.  She is 82, but is in excellent shape.  I would plan to do a wide local excision and sentinel lymph node biopsy.  There is definitely no way to close this with advancement flap closure, so I will close this with skin substitute.  I do think a lymph node biopsy is indicated due to the Breslow depth, the presence of ulceration and the presence of perineural invasion.  We do have viable treatments if she has a positive node.   I discussed the surgery with the patient and her daughter.  I reviewed that I would cut off a significant portion of the skin on the bottom of the foot.  Between 1 and 2 cm would incorporate all of the areas that look like lentigo maligna in addition to the biopsy site.  I discussed that this would need daily dressing changes and would take multiple months to heal.  I think grafting it would be problematic given that it is on the sole of the foot.  I would place her in a postop shoe.  The process of sentinel lymph node biopsy and that there would be 2 tracer was injected into the skin around the biopsy site.  I discussed that there would be an incision and calling that we would take several lymph nodes.  She does not have any evidence of clinical node positivity.  Has risks including potential need for additional surgery, prolonged wound healing, seroma,  infection, heart or lung issues, blood clot.  I discussed that she would need to limit her activities but would be able to walk around her house and very short distances, but I do not want her doing any prolonged walking on the wound site.  I also recommended that she not soak the wound  after surgery.   We will do this at the first available opportunity.  She will need to hold her aspirin preoperatively.     Matthias Hughs, MD

## 2023-01-14 NOTE — Progress Notes (Signed)
SDW call  Patient was given pre-op instructions over the phone. Patient verbalized understanding of instructions provided.    PCP - Dr. Donette Larry Cardiologist - Denies Pulmonary: Denies   PPM/ICD - Denies  Chest x-ray - n/a EKG -  n/a Stress Test - ECHO -  Cardiac Cath -   Sleep Study/sleep apnea/CPAP: Denies  Non-diabetic  Blood Thinner Instructions: Denies Aspirin Instructions:Last dose 01/13/2023   ERAS Protcol - Yes, clear liquids until 0800 PRE-SURGERY Ensure or G2-    COVID TEST- n/a    Anesthesia review: No   Patient denies shortness of breath, fever, cough and chest pain over the phone call  Your procedure is scheduled on Tuesday May 14, 20204  Report to [facility] Main Entrance "A" at  0800  A.M., then check in with the Admitting office.  Call this number if you have problems the morning of surgery:  [phone]   If you have any questions prior to your surgery date call [phone]: Open Monday-Friday 8am-4pm If you experience any cold or flu symptoms such as cough, fever, chills, shortness of breath, etc. between now and your scheduled surgery, please notify us at the above number     Remember:  Do not eat after midnight the night before your surgery  You may drink clear liquids until  0830 the morning of your surgery.   Clear liquids allowed are: Water, Non-Citrus Juices (without pulp), Carbonated Beverages, Clear Tea, Black Coffee ONLY (NO MILK, CREAM OR POWDERED CREAMER of any kind), and Gatorade   Take these medicines the morning of surgery with A SIP OF WATER: None As of today, STOP taking any Aspirin (unless otherwise instructed by your surgeon) Aleve, Naproxen, Ibuprofen, Motrin, Advil, Goody's, BC's, all herbal medications, fish oil, and all vitamins.

## 2023-01-15 ENCOUNTER — Encounter (HOSPITAL_COMMUNITY)
Admission: RE | Disposition: A | Discharge status: Home / Self Care | Payer: Self-pay | Source: Home / Self Care | Attending: General Surgery

## 2023-01-15 ENCOUNTER — Ambulatory Visit (HOSPITAL_BASED_OUTPATIENT_CLINIC_OR_DEPARTMENT_OTHER): Payer: HMO

## 2023-01-15 ENCOUNTER — Encounter (HOSPITAL_COMMUNITY)
Admission: RE | Admit: 2023-01-15 | Discharge: 2023-01-15 | Disposition: A | Discharge status: Home / Self Care | Payer: HMO | Source: Ambulatory Visit | Attending: General Surgery | Admitting: General Surgery

## 2023-01-15 ENCOUNTER — Encounter (HOSPITAL_COMMUNITY): Payer: Self-pay | Admitting: General Surgery

## 2023-01-15 ENCOUNTER — Ambulatory Visit (HOSPITAL_COMMUNITY)
Admission: RE | Admit: 2023-01-15 | Discharge: 2023-01-15 | Disposition: A | Discharge status: Home / Self Care | Payer: HMO | Attending: General Surgery | Admitting: General Surgery

## 2023-01-15 ENCOUNTER — Ambulatory Visit (HOSPITAL_COMMUNITY): Payer: HMO

## 2023-01-15 ENCOUNTER — Other Ambulatory Visit: Payer: Self-pay

## 2023-01-15 DIAGNOSIS — C4372 Malignant melanoma of left lower limb, including hip: Secondary | ICD-10-CM

## 2023-01-15 DIAGNOSIS — M79672 Pain in left foot: Secondary | ICD-10-CM | POA: Insufficient documentation

## 2023-01-15 DIAGNOSIS — Z7982 Long term (current) use of aspirin: Secondary | ICD-10-CM | POA: Diagnosis not present

## 2023-01-15 HISTORY — PX: EXCISION MELANOMA WITH SENTINEL LYMPH NODE BIOPSY: SHX5628

## 2023-01-15 HISTORY — DX: Urinary tract infection, site not specified: N39.0

## 2023-01-15 LAB — CBC
HCT: 39.3 % (ref 36.0–46.0)
Hemoglobin: 12.7 g/dL (ref 12.0–15.0)
MCH: 30.6 pg (ref 26.0–34.0)
MCHC: 32.3 g/dL (ref 30.0–36.0)
MCV: 94.7 fL (ref 80.0–100.0)
Platelets: 193 10*3/uL (ref 150–400)
RBC: 4.15 MIL/uL (ref 3.87–5.11)
RDW: 13.8 % (ref 11.5–15.5)
WBC: 6.4 10*3/uL (ref 4.0–10.5)
nRBC: 0 % (ref 0.0–0.2)

## 2023-01-15 SURGERY — EXCISION, MELANOMA, WITH SENTINEL LYMPH NODE BIOPSY
Anesthesia: General

## 2023-01-15 MED ORDER — ONDANSETRON HCL 4 MG/2ML IJ SOLN
INTRAMUSCULAR | Status: AC
  Filled 2023-01-15: qty 2

## 2023-01-15 MED ORDER — LIDOCAINE-EPINEPHRINE (PF) 1 %-1:200000 IJ SOLN
INTRAMUSCULAR | Status: AC
  Filled 2023-01-15: qty 30

## 2023-01-15 MED ORDER — FENTANYL CITRATE (PF) 100 MCG/2ML IJ SOLN: 25.0000 ug | INTRAMUSCULAR | Status: DC | PRN

## 2023-01-15 MED ORDER — BUPIVACAINE HCL (PF) 0.25 % IJ SOLN
INTRAMUSCULAR | Status: AC
  Filled 2023-01-15: qty 30

## 2023-01-15 MED ORDER — LACTATED RINGERS IV SOLN: INTRAVENOUS | Status: DC

## 2023-01-15 MED ORDER — DEXAMETHASONE SODIUM PHOSPHATE 10 MG/ML IJ SOLN
INTRAMUSCULAR | Status: DC | PRN
  Administered 2023-01-15: 5 mg via INTRAVENOUS

## 2023-01-15 MED ORDER — CHLORHEXIDINE GLUCONATE CLOTH 2 % EX PADS: 6.0000 | MEDICATED_PAD | Freq: Once | CUTANEOUS | Status: DC

## 2023-01-15 MED ORDER — VASOPRESSIN 20 UNIT/ML IV SOLN
INTRAVENOUS | Status: AC
  Filled 2023-01-15: qty 1

## 2023-01-15 MED ORDER — CHLORHEXIDINE GLUCONATE 0.12 % MT SOLN
OROMUCOSAL | Status: AC
  Administered 2023-01-15: 15 mL via OROMUCOSAL
  Filled 2023-01-15: qty 15

## 2023-01-15 MED ORDER — EPHEDRINE SULFATE-NACL 50-0.9 MG/10ML-% IV SOSY
PREFILLED_SYRINGE | INTRAVENOUS | Status: DC | PRN
  Administered 2023-01-15 (×2): 5 mg via INTRAVENOUS

## 2023-01-15 MED ORDER — ONDANSETRON HCL 4 MG/2ML IJ SOLN: 4.0000 mg | Freq: Once | INTRAMUSCULAR | Status: DC | PRN

## 2023-01-15 MED ORDER — ACETAMINOPHEN 500 MG PO TABS
1000.0000 mg | ORAL_TABLET | ORAL | Status: AC
  Administered 2023-01-15: 1000 mg via ORAL
  Filled 2023-01-15: qty 2

## 2023-01-15 MED ORDER — OXYCODONE HCL 5 MG PO TABS
ORAL_TABLET | ORAL | Status: AC
  Filled 2023-01-15: qty 1

## 2023-01-15 MED ORDER — DEXAMETHASONE SODIUM PHOSPHATE 10 MG/ML IJ SOLN
INTRAMUSCULAR | Status: AC
  Filled 2023-01-15: qty 1

## 2023-01-15 MED ORDER — OXYCODONE HCL 5 MG PO TABS
5.0000 mg | ORAL_TABLET | Freq: Once | ORAL | Status: AC
  Administered 2023-01-15: 5 mg via ORAL

## 2023-01-15 MED ORDER — PROPOFOL 10 MG/ML IV BOLUS
INTRAVENOUS | Status: AC
  Filled 2023-01-15: qty 20

## 2023-01-15 MED ORDER — PHENYLEPHRINE HCL-NACL 20-0.9 MG/250ML-% IV SOLN
INTRAVENOUS | Status: DC | PRN
  Administered 2023-01-15: 20 ug/min via INTRAVENOUS

## 2023-01-15 MED ORDER — LIDOCAINE 2% (20 MG/ML) 5 ML SYRINGE
INTRAMUSCULAR | Status: DC | PRN
  Administered 2023-01-15: 40 mg via INTRAVENOUS

## 2023-01-15 MED ORDER — AMISULPRIDE (ANTIEMETIC) 5 MG/2ML IV SOLN: 10.0000 mg | Freq: Once | INTRAVENOUS | Status: DC | PRN

## 2023-01-15 MED ORDER — OXYCODONE HCL 5 MG PO TABS
2.5000 mg | ORAL_TABLET | Freq: Four times a day (QID) | ORAL | 0 refills | Status: DC | PRN
Start: 2023-01-15 — End: 2023-02-21

## 2023-01-15 MED ORDER — METHYLENE BLUE 1 % INJ SOLN
INTRAVENOUS | Status: AC
  Filled 2023-01-15: qty 10

## 2023-01-15 MED ORDER — PROPOFOL 10 MG/ML IV BOLUS
INTRAVENOUS | Status: DC | PRN
  Administered 2023-01-15: 20 mg via INTRAVENOUS
  Administered 2023-01-15: 30 mg via INTRAVENOUS
  Administered 2023-01-15: 80 mg via INTRAVENOUS

## 2023-01-15 MED ORDER — FENTANYL CITRATE (PF) 250 MCG/5ML IJ SOLN
INTRAMUSCULAR | Status: AC
  Filled 2023-01-15: qty 5

## 2023-01-15 MED ORDER — TECHNETIUM TC 99M TILMANOCEPT KIT
0.5000 | PACK | Freq: Once | INTRAVENOUS | Status: AC | PRN
  Administered 2023-01-15: 0.5 via INTRADERMAL

## 2023-01-15 MED ORDER — FENTANYL CITRATE (PF) 250 MCG/5ML IJ SOLN
INTRAMUSCULAR | Status: DC | PRN
  Administered 2023-01-15: 50 ug via INTRAVENOUS
  Administered 2023-01-15: 25 ug via INTRAVENOUS

## 2023-01-15 MED ORDER — CEFAZOLIN SODIUM-DEXTROSE 2-4 GM/100ML-% IV SOLN
2.0000 g | INTRAVENOUS | Status: AC
  Administered 2023-01-15: 2 g via INTRAVENOUS
  Filled 2023-01-15: qty 100

## 2023-01-15 MED ORDER — LIDOCAINE-EPINEPHRINE (PF) 1 %-1:200000 IJ SOLN
INTRAMUSCULAR | Status: DC | PRN
  Administered 2023-01-15: 10 mL via INTRAMUSCULAR

## 2023-01-15 MED ORDER — PHENYLEPHRINE 80 MCG/ML (10ML) SYRINGE FOR IV PUSH (FOR BLOOD PRESSURE SUPPORT)
PREFILLED_SYRINGE | INTRAVENOUS | Status: DC | PRN
  Administered 2023-01-15 (×2): 80 ug via INTRAVENOUS

## 2023-01-15 MED ORDER — ONDANSETRON HCL 4 MG/2ML IJ SOLN
INTRAMUSCULAR | Status: DC | PRN
  Administered 2023-01-15: 4 mg via INTRAVENOUS

## 2023-01-15 MED ORDER — CHLORHEXIDINE GLUCONATE 0.12 % MT SOLN: 15.0000 mL | OROMUCOSAL | Status: AC

## 2023-01-15 MED ORDER — METHYLENE BLUE 1 % INJ SOLN
INTRAVENOUS | Status: DC | PRN
  Administered 2023-01-15: 4 mL

## 2023-01-15 SURGICAL SUPPLY — 55 items
APL PRP STRL LF DISP 70% ISPRP (MISCELLANEOUS) ×1
BAG COUNTER SPONGE SURGICOUNT (BAG) ×1 IMPLANT
BAG SPNG CNTER NS LX DISP (BAG) ×1
BLADE SURG 10 STRL SS (BLADE) ×1 IMPLANT
BNDG COHESIVE 4X5 TAN STRL (GAUZE/BANDAGES/DRESSINGS) IMPLANT
BNDG GAUZE DERMACEA FLUFF 4 (GAUZE/BANDAGES/DRESSINGS) ×1 IMPLANT
BNDG GZE DERMACEA 4 6PLY (GAUZE/BANDAGES/DRESSINGS) ×1
CANISTER SUCT 3000ML PPV (MISCELLANEOUS) ×1 IMPLANT
CHLORAPREP W/TINT 26 (MISCELLANEOUS) ×1 IMPLANT
CLIP TI MEDIUM 24 (CLIP) ×1 IMPLANT
CLIP TI WIDE RED SMALL 24 (CLIP) ×1 IMPLANT
CNTNR URN SCR LID CUP LEK RST (MISCELLANEOUS) ×3 IMPLANT
CONT SPEC 4OZ STRL OR WHT (MISCELLANEOUS) ×3
COVER MAYO STAND STRL (DRAPES) IMPLANT
COVER PROBE W GEL 5X96 (DRAPES) ×1 IMPLANT
COVER SURGICAL LIGHT HANDLE (MISCELLANEOUS) ×1 IMPLANT
DRSG TEGADERM 4X4.75 (GAUZE/BANDAGES/DRESSINGS) ×2 IMPLANT
DRSG TELFA 3X8 NADH STRL (GAUZE/BANDAGES/DRESSINGS) IMPLANT
ELECT REM PT RETURN 9FT ADLT (ELECTROSURGICAL) ×1
ELECTRODE REM PT RTRN 9FT ADLT (ELECTROSURGICAL) ×1 IMPLANT
GAUZE SPONGE 2X2 8PLY STRL LF (GAUZE/BANDAGES/DRESSINGS) ×1 IMPLANT
GLOVE BIO SURGEON STRL SZ 6 (GLOVE) ×1 IMPLANT
GLOVE INDICATOR 6.5 STRL GRN (GLOVE) ×1 IMPLANT
GOWN STRL REUS W/ TWL LRG LVL3 (GOWN DISPOSABLE) ×2 IMPLANT
GOWN STRL REUS W/ TWL XL LVL3 (GOWN DISPOSABLE) ×2 IMPLANT
GOWN STRL REUS W/TWL LRG LVL3 (GOWN DISPOSABLE) ×2
GOWN STRL REUS W/TWL XL LVL3 (GOWN DISPOSABLE) ×2
GRAFT MYRIAD 3 LAYER 5X5 (Graft) IMPLANT
KIT BASIN OR (CUSTOM PROCEDURE TRAY) ×1 IMPLANT
KIT TURNOVER KIT B (KITS) ×1 IMPLANT
MARKER SKIN DUAL TIP RULER LAB (MISCELLANEOUS) ×1 IMPLANT
NDL 18GX1X1/2 (RX/OR ONLY) (NEEDLE) ×1 IMPLANT
NDL 22X1.5 STRL (OR ONLY) (MISCELLANEOUS) ×1 IMPLANT
NDL FILTER BLUNT 18X1 1/2 (NEEDLE) IMPLANT
NDL HYPO 25GX1X1/2 BEV (NEEDLE) ×1 IMPLANT
NEEDLE 18GX1X1/2 (RX/OR ONLY) (NEEDLE) ×1 IMPLANT
NEEDLE 22X1.5 STRL (OR ONLY) (MISCELLANEOUS) ×1 IMPLANT
NEEDLE FILTER BLUNT 18X1 1/2 (NEEDLE) IMPLANT
NEEDLE HYPO 25GX1X1/2 BEV (NEEDLE) ×1 IMPLANT
NS IRRIG 1000ML POUR BTL (IV SOLUTION) ×1 IMPLANT
PACK GENERAL/GYN (CUSTOM PROCEDURE TRAY) ×1 IMPLANT
PAD ARMBOARD 7.5X6 YLW CONV (MISCELLANEOUS) ×2 IMPLANT
PENCIL SMOKE EVACUATOR (MISCELLANEOUS) ×1 IMPLANT
POWDER MYRIAD MORCELLS 500MG (Miscellaneous) IMPLANT
SPECIMEN JAR MEDIUM (MISCELLANEOUS) ×1 IMPLANT
SPIKE FLUID TRANSFER (MISCELLANEOUS) ×1 IMPLANT
STRIP CLOSURE SKIN 1/2X4 (GAUZE/BANDAGES/DRESSINGS) ×1 IMPLANT
SUT MNCRL AB 4-0 PS2 18 (SUTURE) ×1 IMPLANT
SUT PROLENE 3 0 PS 2 (SUTURE) IMPLANT
SUT SILK 2 0 PERMA HAND 18 BK (SUTURE) ×1 IMPLANT
SUT VIC AB 3-0 SH 27 (SUTURE) ×3
SUT VIC AB 3-0 SH 27X BRD (SUTURE) ×2 IMPLANT
SYR CONTROL 10ML LL (SYRINGE) ×2 IMPLANT
TOWEL GREEN STERILE (TOWEL DISPOSABLE) ×1 IMPLANT
TOWEL GREEN STERILE FF (TOWEL DISPOSABLE) ×1 IMPLANT

## 2023-01-15 NOTE — Interval H&P Note (Signed)
History and Physical Interval Note:  01/15/2023 10:58 AM  Regina Moody  has presented today for surgery, with the diagnosis of LEFT FOOT MELANOMA.  The various methods of treatment have been discussed with the patient and family. After consideration of risks, benefits and other options for treatment, the patient has consented to  Procedure(s): WIDE LOCAL EXCISION OF LEFT FOOT MELANOMA PLACEMENT SKIN SUBSTITUTE WITH SENTINEL NODE BIOPSY (N/A) as a surgical intervention.  The patient's history has been reviewed, patient examined, no change in status, stable for surgery.  I have reviewed the patient's chart and labs.  Questions were answered to the patient's satisfaction.     [name]

## 2023-01-15 NOTE — Progress Notes (Signed)
Orthopedic Tech Progress Note Patient Details:  Regina Moody [birth year] [id number]  OR RN called requesting a MEDIUM SIZE POST OP SHOE for patient. Dropped off to desk   Ortho Devices Type of Ortho Device: Postop shoe/boot Ortho Device/Splint Location: LLE Ortho Device/Splint Interventions: Ordered, Other (comment)   Post Interventions Patient Tolerated: Well Instructions Provided: Care of device  [name] 01/15/2023, 12:10 PM

## 2023-01-15 NOTE — Anesthesia Procedure Notes (Signed)
Procedure Name: LMA Insertion Date/Time: 01/15/2023 11:15 AM  Performed by: Darryl Nestle, CRNAPre-anesthesia Checklist: Patient identified, Emergency Drugs available, Suction available, Patient being monitored and Timeout performed Patient Re-evaluated:Patient Re-evaluated prior to induction Oxygen Delivery Method: Circle system utilized and Simple face mask Preoxygenation: Pre-oxygenation with 100% oxygen Induction Type: IV induction Ventilation: Mask ventilation without difficulty LMA: LMA inserted LMA Size: 3.0 Dental Injury: Teeth and Oropharynx as per pre-operative assessment

## 2023-01-15 NOTE — Transfer of Care (Signed)
Immediate Anesthesia Transfer of Care Note  Patient: Regina Moody  Procedure(s) Performed: WIDE LOCAL EXCISION OF LEFT FOOT MELANOMA PLACEMENT SKIN SUBSTITUTE WITH SENTINEL NODE BIOPSY  Patient Location: PACU  Anesthesia Type:General  Level of Consciousness: awake, alert , and oriented  Airway & Oxygen Therapy: Patient Spontanous Breathing  Post-op Assessment: Report given to RN and Post -op Vital signs reviewed and stable  Post vital signs: Reviewed and stable  Last Vitals:  Vitals Value Taken Time  BP 141/57 01/15/23 1300  Temp 36.6 C 01/15/23 1240  Pulse 69 01/15/23 1306  Resp 15 01/15/23 1306  SpO2 100 % 01/15/23 1306  Vitals shown include unvalidated device data.  Last Pain:  Vitals:   01/15/23 1300  TempSrc:   PainSc: 0-No pain      Patients Stated Pain Goal: 0 (01/15/23 0828)  Complications: No notable events documented.

## 2023-01-15 NOTE — Discharge Instructions (Addendum)
Wound care:   Remove all gauze down to the greenish/blue layer.   Re-dress wound with surgilube (use more than you would think).  The goal is for the wound to stay moist.   The apply non stick gauze (telfa or similar) and wrap foot/ankle with ace wrap or coban. Wear hard sole shoe while ambulating.      [facility] Office Phone Number [phone]   POST OP INSTRUCTIONS  Always review your discharge instruction sheet given to you by the facility where your surgery was performed.  IF YOU HAVE DISABILITY OR FAMILY LEAVE FORMS, YOU MUST BRING THEM TO THE OFFICE FOR PROCESSING.  DO NOT GIVE THEM TO YOUR DOCTOR.  Take 2 tylenol (acetominophen) three times a day for 3 days.  If you still have pain, add ibuprofen with food in between if able to take this (if you have kidney issues or stomach issues, do not take ibuprofen).  If both of those are not enough, add the narcotic pain pill.  If you find you are needing a lot of this overnight after surgery, call the next morning for a refill.   Take your usually prescribed medications unless otherwise directed If you need a refill on your pain medication, please contact your pharmacy.  They will contact our office to request authorization.  Prescriptions will not be filled after 5pm or on week-ends. You should eat very light the first 24 hours after surgery, such as soup, crackers, pudding, etc.  Resume your normal diet the day after surgery It is common to experience some constipation if taking pain medication after surgery.  Increasing fluid intake and taking a stool softener will usually help or prevent this problem from occurring.  A mild laxative (Milk of Magnesia or Miralax) should be taken according to package directions if there are no bowel movements after 48 hours. You may shower in 48 hours.  The surgical glue will flake off in 2-3 weeks.   ACTIVITIES:  No strenuous activity or heavy lifting for 1 week.   You may drive when you  no longer are taking prescription pain medication, you can comfortably wear a seatbelt, and you can safely maneuver your car and apply brakes. RETURN TO WORK:  __________n/a_______________ Regina Moody should see your doctor in the office for a follow-up appointment approximately three-four weeks after your surgery.    WHEN TO CALL YOUR DOCTOR: Fever over 101.0 Nausea and/or vomiting. Extreme swelling or bruising. Continued bleeding from incision. Increased pain, redness, or drainage from the incision.  The clinic staff is available to answer your questions during regular business hours.  Please don't hesitate to call and ask to speak to one of the nurses for clinical concerns.  If you have a medical emergency, go to the nearest emergency room or call 911.  A surgeon from [facility] is always on call at the hospital.  For further questions, please visit [email or web address]

## 2023-01-15 NOTE — Anesthesia Postprocedure Evaluation (Signed)
Anesthesia Post Note  Patient: Regina Moody  Procedure(s) Performed: WIDE LOCAL EXCISION OF LEFT FOOT MELANOMA PLACEMENT SKIN SUBSTITUTE WITH SENTINEL NODE BIOPSY     Patient location during evaluation: PACU Anesthesia Type: General Level of consciousness: awake Pain management: pain level controlled Vital Signs Assessment: post-procedure vital signs reviewed and stable Respiratory status: spontaneous breathing, nonlabored ventilation and respiratory function stable Cardiovascular status: blood pressure returned to baseline and stable Postop Assessment: no apparent nausea or vomiting Anesthetic complications: no   No notable events documented.  Last Vitals:  Vitals:   01/15/23 1300 01/15/23 1315  BP: (!) 141/57 (!) 144/57  Pulse: 68 72  Resp: 12 16  Temp:  36.7 C  SpO2: 100% 100%    Last Pain:  Vitals:   01/15/23 1315  TempSrc:   PainSc: 0-No pain                 Willem Klingensmith P Narcissa Melder

## 2023-01-15 NOTE — Op Note (Addendum)
PRE-OPERATIVE DIAGNOSIS: cT3bN0 left foot melanoma, plantar surface  POST-OPERATIVE DIAGNOSIS:  Same  PROCEDURE:  Procedure(s): Wide local excision 1 cm margins, skin substitute coverage for defect 3.2 cm x 3.5 cm, left inguinal sentinel lymph node mapping and biopsy  SURGEON:  Surgeon(s): [name], MD  ASSIST:  Carrie [place], RNFA  ANESTHESIA:   local and general  DRAINS: none   LOCAL MEDICATIONS USED:  MARCAINE    and XYLOCAINE   SPECIMEN:  Source of Specimen:  One left inguinal sentinel lymph node, wide local excision left foot melanoma  FINDINGS:  some pigmented lesion remaining on foot, no palpable nodes, 1 SLN cps 280, background 15 cps. The excision extends to the base of the fifth toe on the plantar surface.    DISPOSITION OF SPECIMEN:  PATHOLOGY  COUNTS:  YES  PLAN OF CARE: Discharge to home after PACU  PATIENT DISPOSITION:  PACU - hemodynamically stable.    PROCEDURE:   Pt was identified in the holding area, taken to the OR, and placed supine on the OR table.  General anesthesia was induced.  The patient was placed into the supine position.  The left foot, leg and groin were prepped and draped in sterile fashion.Time out was performed according to the surgical safety checklist.  When all was correct, we continued.  One mL methylene blue was injected intradermally around the melanoma biopsy site.    The point of maximum signal intensity was identified with the neoprobe.  A 3 cm incision was made with a #15 blade in the medial left upper thigh just below the inguinal crease.  The subcutaneous tissues were divided with the cautery.  A Weitlaner retractor was used to assist with visualization.  The tonsil clamp was used to bluntly dissect the fat pad.  One sentinel lymph node was identified as described above.  The lymphovascular channels were clipped with hemoclips.  The node was passed off as specimens.  Hemostasis was achieved with the cautery.  The wound was irrigated  and closed with 3-0 Vicryl deep dermal interrupted sutures and 4-0 Monocryl running subcuticular suture.  The wound was dressed with dermabond.    The melanoma was identified and 1 cm margins were marked out. This included the scar from biopsy as well as some surrounding abnormally pigmented lesion.  Local was administered under the melanoma and the adjacent tissue.  A #10 blade was used to incise the skin around the melanoma.  The cautery was used to take the dissection down to the fascia.  The skin was marked in situ with orientation sutures.  The cautery was used to take the specimen off the fascia, and it was passed off the table.    The defect was measured as above.  The 5x5 cm myriad matrix was selected.  The proximal aspect of the wound was sutured to the myriad matrix around 1/3 of the way around.  A paste was made with saline and myriad morcells. This was placed over the defect.  The remainder of the defect was then closed over the morcell paste with the matrix.  This was done with running 3-0 vicryl.  Sorbac was then used to cover the wound and the myriad matrix.  This was secured to the skin surrounding the wound with interrupted 3-0 prolenes.  Surgilube was placed under the sorbac.    The melanoma site was then dressed with additional surgilube, telfa, gauze, and ace wrap.    Needle, sponge, and instrument counts were correct.  The  patient was awakened from anesthesia and taken to the PACU in stable condition.

## 2023-01-15 NOTE — Anesthesia Preprocedure Evaluation (Addendum)
Anesthesia Evaluation  Patient identified by MRN, date of birth, ID band Patient awake    Reviewed: Allergy & Precautions, NPO status , Patient's Chart, lab work & pertinent test results  Airway Mallampati: II  TM Distance: >3 FB Neck ROM: Full    Dental no notable dental hx.    Pulmonary neg pulmonary ROS   Pulmonary exam normal        Cardiovascular negative cardio ROS Normal cardiovascular exam     Neuro/Psych  Headaches  negative psych ROS   GI/Hepatic negative GI ROS, Neg liver ROS,,,  Endo/Other  negative endocrine ROS    Renal/GU negative Renal ROS     Musculoskeletal negative musculoskeletal ROS (+)    Abdominal   Peds  Hematology negative hematology ROS (+)   Anesthesia Other Findings LEFT FOOT MELANOMA  Reproductive/Obstetrics                             Anesthesia Physical Anesthesia Plan  ASA: 2  Anesthesia Plan: General   Post-op Pain Management:    Induction: Intravenous  PONV Risk Score and Plan: 3 and Ondansetron, Dexamethasone and Treatment may vary due to age or medical condition  Airway Management Planned: LMA  Additional Equipment:   Intra-op Plan:   Post-operative Plan: Extubation in OR  Informed Consent: I have reviewed the patients History and Physical, chart, labs and discussed the procedure including the risks, benefits and alternatives for the proposed anesthesia with the patient or authorized representative who has indicated his/her understanding and acceptance.     Dental advisory given  Plan Discussed with: CRNA  Anesthesia Plan Comments:        Anesthesia Quick Evaluation

## 2023-01-16 ENCOUNTER — Encounter (HOSPITAL_COMMUNITY): Payer: Self-pay | Admitting: General Surgery

## 2023-01-17 DIAGNOSIS — M1990 Unspecified osteoarthritis, unspecified site: Secondary | ICD-10-CM | POA: Diagnosis not present

## 2023-01-17 DIAGNOSIS — C4372 Malignant melanoma of left lower limb, including hip: Secondary | ICD-10-CM | POA: Diagnosis not present

## 2023-01-17 DIAGNOSIS — Z483 Aftercare following surgery for neoplasm: Secondary | ICD-10-CM | POA: Diagnosis not present

## 2023-01-23 LAB — SURGICAL PATHOLOGY

## 2023-01-25 ENCOUNTER — Encounter (HOSPITAL_COMMUNITY): Payer: Self-pay

## 2023-01-25 ENCOUNTER — Other Ambulatory Visit: Payer: Self-pay

## 2023-01-25 ENCOUNTER — Emergency Department (HOSPITAL_COMMUNITY)
Admission: EM | Admit: 2023-01-25 | Discharge: 2023-01-25 | Disposition: A | Discharge status: Home / Self Care | Payer: HMO | Attending: Emergency Medicine | Admitting: Emergency Medicine

## 2023-01-25 DIAGNOSIS — E876 Hypokalemia: Secondary | ICD-10-CM | POA: Insufficient documentation

## 2023-01-25 DIAGNOSIS — Z7982 Long term (current) use of aspirin: Secondary | ICD-10-CM | POA: Insufficient documentation

## 2023-01-25 DIAGNOSIS — S7012XA Contusion of left thigh, initial encounter: Secondary | ICD-10-CM | POA: Insufficient documentation

## 2023-01-25 DIAGNOSIS — D649 Anemia, unspecified: Secondary | ICD-10-CM | POA: Diagnosis not present

## 2023-01-25 DIAGNOSIS — X58XXXA Exposure to other specified factors, initial encounter: Secondary | ICD-10-CM | POA: Diagnosis not present

## 2023-01-25 DIAGNOSIS — R2232 Localized swelling, mass and lump, left upper limb: Secondary | ICD-10-CM | POA: Diagnosis present

## 2023-01-25 DIAGNOSIS — G8918 Other acute postprocedural pain: Secondary | ICD-10-CM | POA: Insufficient documentation

## 2023-01-25 LAB — CBC WITH DIFFERENTIAL/PLATELET
Abs Immature Granulocytes: 0.03 10*3/uL (ref 0.00–0.07)
Basophils Absolute: 0 10*3/uL (ref 0.0–0.1)
Basophils Relative: 1 %
Eosinophils Absolute: 0.1 10*3/uL (ref 0.0–0.5)
Eosinophils Relative: 1 %
HCT: 32.9 % — ABNORMAL LOW (ref 36.0–46.0)
Hemoglobin: 10.6 g/dL — ABNORMAL LOW (ref 12.0–15.0)
Immature Granulocytes: 0 %
Lymphocytes Relative: 22 %
Lymphs Abs: 1.9 10*3/uL (ref 0.7–4.0)
MCH: 30.7 pg (ref 26.0–34.0)
MCHC: 32.2 g/dL (ref 30.0–36.0)
MCV: 95.4 fL (ref 80.0–100.0)
Monocytes Absolute: 0.6 10*3/uL (ref 0.1–1.0)
Monocytes Relative: 8 %
Neutro Abs: 5.8 10*3/uL (ref 1.7–7.7)
Neutrophils Relative %: 68 %
Platelets: 134 10*3/uL — ABNORMAL LOW (ref 150–400)
RBC: 3.45 MIL/uL — ABNORMAL LOW (ref 3.87–5.11)
RDW: 13.8 % (ref 11.5–15.5)
WBC: 8.5 10*3/uL (ref 4.0–10.5)
nRBC: 0 % (ref 0.0–0.2)

## 2023-01-25 LAB — BASIC METABOLIC PANEL
Anion gap: 7 (ref 5–15)
BUN: 15 mg/dL (ref 8–23)
CO2: 17 mmol/L — ABNORMAL LOW (ref 22–32)
Calcium: 6.5 mg/dL — ABNORMAL LOW (ref 8.9–10.3)
Chloride: 117 mmol/L — ABNORMAL HIGH (ref 98–111)
Creatinine, Ser: 0.58 mg/dL (ref 0.44–1.00)
GFR, Estimated: >60 mL/min (ref >60)
Glucose, Bld: 72 mg/dL (ref 70–99)
Potassium: 3 mmol/L — ABNORMAL LOW (ref 3.5–5.1)
Sodium: 141 mmol/L (ref 135–145)

## 2023-01-25 NOTE — Progress Notes (Signed)
She has concerns about swelling in her leg. SHe underwent left inguinal SLN on 5/14 and has a moderate sized hematoma with ecchymosis over the anterior thigh. -recommended ice over the area -incision intact -reassured patient and family -follow up with Dr. Donell Beers 6/5

## 2023-01-25 NOTE — ED Provider Notes (Signed)
[facility] AT [facility] Provider Note   CSN: [id number] Arrival date & time: 01/25/23  1212     History Chief Complaint  Patient presents with   Groin Swelling    Regina Moody is a 82 y.o. female.  Patient presents emergency department complaints of groin swelling.  Patient reports that she had excision of melanoma removed from her left foot as well as sentinel lymph node biopsy performed about 2 weeks ago.  Patient was planning to follow-up with general surgery yesterday/today but was unable to see them due to issues at the office.  She is endorsing notable pain and swelling to the left inguinal fold at the site of incision but no obvious drainage or discharge coming from the site.  Denies any loss of sensation or function in the affected leg but does report there is notable bruising in the area.  No prior history of any bleeding disorders not currently on any anticoagulants.  HPI     Home Medications Prior to Admission medications   Medication Sig Start Date End Date Taking? Authorizing Provider  aspirin EC 81 MG tablet Take 81 mg by mouth daily. Swallow whole.    [provider]  benzonatate (TESSALON) 100 MG capsule Take 1 capsule (100 mg total) by mouth 3 (three) times daily as needed for cough. Do not take with alcohol or while driving or operating heavy machinery.  May cause drowsiness. Patient not taking: Reported on 01/11/2023 10/24/22   Valentino Nose, NP  Cholecalciferol (VITAMIN D3 MAXIMUM STRENGTH) 125 MCG (5000 UT) capsule Take 5,000 Units by mouth daily.    [provider]  ibuprofen (ADVIL) 200 MG tablet Take 200 mg by mouth every 6 (six) hours as needed for mild pain.    [provider]  oxyCODONE (OXY IR/ROXICODONE) 5 MG immediate release tablet Take 0.5-1 tablets (2.5-5 mg total) by mouth every 6 (six) hours as needed for severe pain. 01/15/23   [name], MD      Allergies    Patient has no known  allergies.    Review of Systems   Review of Systems  Hematological:  Bruises/bleeds easily.  All other systems reviewed and are negative.   Physical Exam Updated Vital Signs BP (!) 134/53 (BP Location: Left Arm)   Pulse 81   Temp 98.2 F (36.8 C) (Oral)   Resp 16   Ht 4\' 11"  (1.499 m)   Wt 44.5 kg   SpO2 100%   BMI 19.79 kg/m  Physical Exam Vitals and nursing note reviewed.  Constitutional:      General: She is not in acute distress.    Appearance: She is well-developed.  HENT:     Head: Normocephalic and atraumatic.  Eyes:     Conjunctiva/sclera: Conjunctivae normal.  Cardiovascular:     Rate and Rhythm: Normal rate and regular rhythm.     Heart sounds: No murmur heard. Pulmonary:     Effort: Pulmonary effort is normal. No respiratory distress.     Breath sounds: Normal breath sounds.  Abdominal:     Palpations: Abdomen is soft.     Tenderness: There is no abdominal tenderness.  Musculoskeletal:        General: No swelling.     Cervical back: Neck supple.  Skin:    General: Skin is warm and dry.     Capillary Refill: Capillary refill takes less than 2 seconds.     Findings: Bruising and erythema present.  Comments: Slight erythema noted to the incision of the left inguinal fold.  Significant bruising noted to the left thigh.  Neurological:     Mental Status: She is alert.  Psychiatric:        Mood and Affect: Mood normal.     ED Results / Procedures / Treatments   Labs (all labs ordered are listed, but only abnormal results are displayed) Labs Reviewed  CBC WITH DIFFERENTIAL/PLATELET - Abnormal; Notable for the following components:      Result Value   RBC 3.45 (*)    Hemoglobin 10.6 (*)    HCT 32.9 (*)    Platelets 134 (*)    All other components within normal limits  BASIC METABOLIC PANEL - Abnormal; Notable for the following components:   Potassium 3.0 (*)    Chloride 117 (*)    CO2 17 (*)    Calcium 6.5 (*)    All other components within  normal limits    EKG None  Radiology No results found.  Procedures Procedures   Medications Ordered in ED Medications - No data to display  ED Course/ Medical Decision Making/ A&P                           Medical Decision Making Amount and/or Complexity of Data Reviewed Labs: ordered.   This patient presents to the ED for concern of groin swelling.  Differential diagnosis includes cellulitis, abscess, pseudoaneurysm, DVT   Lab Tests:  I Ordered, and personally interpreted labs.  The pertinent results include: CBC with mild anemia noted compared to baseline but not significant enough for transfusion, BMP with mild hypokalemia noted  Problem List / ED Course:  Patient presents emergency department complaints of groin swelling.  Patient had excision of melanoma removed from the left foot as well as sentinel node biopsy in the left inguinal canal.  She is now complaining of swelling at the site in her groin is concerned about possible infection.  She was reportedly supposed to follow-up with general surgery today/yesterday but was unable to due to issues in their office. Spoke with Tresa Endo, PA-C, general surgery who had minimal concern for patient's presentation at this time.  Also spoke with Dr. Sheliah Hatch, general surgery, who will come to evaluate patient to ensure that there is no acute concerns at this time with patient's surgical site.  Will wait for any recommendations from Dr. Sheliah Hatch. Dr. Sheliah Hatch evaluated patient believes the patient is stable for discharge and has no other acute concerns at this time.  He believes that the bruising and pain is part of the normal healing process of the surgical incision at that site.  Does not recommend any further evaluation at this time and patient is safe to follow-up outpatient with surgeon.  Informed patient of plan and patient is agreeable treatment plan verbalized understanding all return precautions.  All questions answered prior to  patient discharge.  Final Clinical Impression(s) / ED Diagnoses Final diagnoses:  Post-op pain    Rx / DC Orders ED Discharge Orders     None         Salomon Mast 01/25/23 2119    Linwood Dibbles, MD 01/26/23 [phone]

## 2023-01-25 NOTE — Discharge Instructions (Signed)
You were seen in the emergency department today for swelling in the groin.  Your labs are reassuring without any signs of any infection or significant blood loss.  You were evaluated by the general surgery team here and they do not have any acute concerns.  They recommend that you ice the area and keep the area elevated when resting and also taking Tylenol, ibuprofen as needed for pain.  Please follow-up with your surgery team as planned to ensure that you have adequate follow-up as needed.

## 2023-01-25 NOTE — ED Triage Notes (Signed)
Pt arrives today c/o swelling in her groin. Pt had lymph node and foot surgery 5/14. Pt has swelling noted to her left upper leg and groin.

## 2023-02-06 ENCOUNTER — Other Ambulatory Visit: Payer: Self-pay | Admitting: General Surgery

## 2023-02-08 ENCOUNTER — Encounter (HOSPITAL_BASED_OUTPATIENT_CLINIC_OR_DEPARTMENT_OTHER): Payer: Self-pay | Admitting: General Surgery

## 2023-02-08 ENCOUNTER — Other Ambulatory Visit: Payer: Self-pay

## 2023-02-12 ENCOUNTER — Ambulatory Visit
Admission: RE | Admit: 2023-02-12 | Discharge: 2023-02-12 | Disposition: A | Discharge status: Home / Self Care | Payer: HMO | Source: Ambulatory Visit | Attending: Internal Medicine | Admitting: Internal Medicine

## 2023-02-12 DIAGNOSIS — R636 Underweight: Secondary | ICD-10-CM | POA: Diagnosis not present

## 2023-02-12 DIAGNOSIS — Z90721 Acquired absence of ovaries, unilateral: Secondary | ICD-10-CM | POA: Diagnosis not present

## 2023-02-12 DIAGNOSIS — M810 Age-related osteoporosis without current pathological fracture: Secondary | ICD-10-CM

## 2023-02-12 DIAGNOSIS — E349 Endocrine disorder, unspecified: Secondary | ICD-10-CM | POA: Diagnosis not present

## 2023-02-12 DIAGNOSIS — Z90710 Acquired absence of both cervix and uterus: Secondary | ICD-10-CM | POA: Diagnosis not present

## 2023-02-12 MED ORDER — CHLORHEXIDINE GLUCONATE CLOTH 2 % EX PADS: 6.0000 | MEDICATED_PAD | Freq: Once | CUTANEOUS | Status: DC

## 2023-02-12 NOTE — Progress Notes (Signed)

## 2023-02-14 ENCOUNTER — Encounter (HOSPITAL_BASED_OUTPATIENT_CLINIC_OR_DEPARTMENT_OTHER): Payer: Self-pay | Admitting: General Surgery

## 2023-02-14 DIAGNOSIS — Z01818 Encounter for other preprocedural examination: Secondary | ICD-10-CM

## 2023-02-20 NOTE — Anesthesia Preprocedure Evaluation (Signed)
Anesthesia Evaluation  Patient identified by MRN, date of birth, ID band Patient awake    Reviewed: Allergy & Precautions, H&P , NPO status , Patient's Chart, lab work & pertinent test results  Airway Mallampati: II  TM Distance: >3 FB Neck ROM: Full    Dental no notable dental hx.    Pulmonary neg pulmonary ROS   Pulmonary exam normal breath sounds clear to auscultation       Cardiovascular Exercise Tolerance: Good negative cardio ROS Normal cardiovascular exam Rhythm:Regular Rate:Normal     Neuro/Psych  Headaches negative neurological ROS  negative psych ROS   GI/Hepatic negative GI ROS, Neg liver ROS,,,  Endo/Other  negative endocrine ROS    Renal/GU negative Renal ROS  negative genitourinary   Musculoskeletal negative musculoskeletal ROS (+)    Abdominal   Peds negative pediatric ROS (+)  Hematology negative hematology ROS (+)   Anesthesia Other Findings   Reproductive/Obstetrics negative OB ROS                             Anesthesia Physical Anesthesia Plan  ASA: 2  Anesthesia Plan: General   Post-op Pain Management: Tylenol PO (pre-op)*   Induction: Intravenous  PONV Risk Score and Plan: 3 and Ondansetron, Dexamethasone, TIVA and Treatment may vary due to age or medical condition  Airway Management Planned: LMA  Additional Equipment: None  Intra-op Plan:   Post-operative Plan: Extubation in OR  Informed Consent: I have reviewed the patients History and Physical, chart, labs and discussed the procedure including the risks, benefits and alternatives for the proposed anesthesia with the patient or authorized representative who has indicated his/her understanding and acceptance.     Dental advisory given  Plan Discussed with: CRNA and Surgeon  Anesthesia Plan Comments:        Anesthesia Quick Evaluation

## 2023-02-21 ENCOUNTER — Other Ambulatory Visit: Payer: Self-pay

## 2023-02-21 ENCOUNTER — Ambulatory Visit (HOSPITAL_BASED_OUTPATIENT_CLINIC_OR_DEPARTMENT_OTHER): Payer: HMO | Admitting: Anesthesiology

## 2023-02-21 ENCOUNTER — Ambulatory Visit (HOSPITAL_COMMUNITY)
Admission: RE | Admit: 2023-02-21 | Discharge: 2023-02-21 | Disposition: A | Discharge status: Home / Self Care | Payer: HMO | Attending: General Surgery | Admitting: General Surgery

## 2023-02-21 ENCOUNTER — Encounter (HOSPITAL_COMMUNITY): Payer: Self-pay | Admitting: General Surgery

## 2023-02-21 ENCOUNTER — Ambulatory Visit (HOSPITAL_COMMUNITY): Payer: HMO | Admitting: Anesthesiology

## 2023-02-21 ENCOUNTER — Encounter (HOSPITAL_COMMUNITY)
Admission: RE | Disposition: A | Discharge status: Home / Self Care | Payer: Self-pay | Source: Home / Self Care | Attending: General Surgery

## 2023-02-21 DIAGNOSIS — Z01818 Encounter for other preprocedural examination: Secondary | ICD-10-CM

## 2023-02-21 DIAGNOSIS — Z7982 Long term (current) use of aspirin: Secondary | ICD-10-CM | POA: Insufficient documentation

## 2023-02-21 DIAGNOSIS — C4372 Malignant melanoma of left lower limb, including hip: Secondary | ICD-10-CM | POA: Diagnosis not present

## 2023-02-21 DIAGNOSIS — D0372 Melanoma in situ of left lower limb, including hip: Secondary | ICD-10-CM | POA: Diagnosis not present

## 2023-02-21 HISTORY — PX: AMPUTATION TOE: SHX6595

## 2023-02-21 HISTORY — PX: MELANOMA EXCISION: SHX5266

## 2023-02-21 LAB — POCT I-STAT, CHEM 8
BUN: 37 mg/dL — ABNORMAL HIGH (ref 8–23)
Calcium, Ion: 1.14 mmol/L — ABNORMAL LOW (ref 1.15–1.40)
Chloride: 108 mmol/L (ref 98–111)
Creatinine, Ser: 0.8 mg/dL (ref 0.44–1.00)
Glucose, Bld: 75 mg/dL (ref 70–99)
HCT: 35 % — ABNORMAL LOW (ref 36.0–46.0)
Hemoglobin: 11.9 g/dL — ABNORMAL LOW (ref 12.0–15.0)
Potassium: 4.1 mmol/L (ref 3.5–5.1)
Sodium: 139 mmol/L (ref 135–145)
TCO2: 24 mmol/L (ref 22–32)

## 2023-02-21 SURGERY — EXCISION, MELANOMA
Anesthesia: General | Site: Toe

## 2023-02-21 MED ORDER — ACETAMINOPHEN 500 MG PO TABS: 1000.0000 mg | ORAL_TABLET | ORAL | Status: DC

## 2023-02-21 MED ORDER — CHLORHEXIDINE GLUCONATE 0.12 % MT SOLN
OROMUCOSAL | Status: AC
  Administered 2023-02-21: 15 mL
  Filled 2023-02-21: qty 15

## 2023-02-21 MED ORDER — LACTATED RINGERS IV SOLN: INTRAVENOUS | Status: DC

## 2023-02-21 MED ORDER — LIDOCAINE 2% (20 MG/ML) 5 ML SYRINGE
INTRAMUSCULAR | Status: DC | PRN
  Administered 2023-02-21: 100 mg via INTRAVENOUS

## 2023-02-21 MED ORDER — FENTANYL CITRATE (PF) 250 MCG/5ML IJ SOLN
INTRAMUSCULAR | Status: AC
  Filled 2023-02-21: qty 5

## 2023-02-21 MED ORDER — CEFAZOLIN SODIUM-DEXTROSE 2-4 GM/100ML-% IV SOLN
2.0000 g | INTRAVENOUS | Status: AC
  Administered 2023-02-21: 2 g via INTRAVENOUS

## 2023-02-21 MED ORDER — 0.9 % SODIUM CHLORIDE (POUR BTL) OPTIME
TOPICAL | Status: DC | PRN
  Administered 2023-02-21: 1000 mL

## 2023-02-21 MED ORDER — LIDOCAINE HCL 1 % IJ SOLN
INTRAMUSCULAR | Status: DC | PRN
  Administered 2023-02-21: 10 mL

## 2023-02-21 MED ORDER — FENTANYL CITRATE (PF) 250 MCG/5ML IJ SOLN
INTRAMUSCULAR | Status: DC | PRN
  Administered 2023-02-21 (×3): 25 ug via INTRAVENOUS

## 2023-02-21 MED ORDER — OXYCODONE HCL 5 MG PO TABS
2.5000 mg | ORAL_TABLET | Freq: Four times a day (QID) | ORAL | 0 refills | Status: DC | PRN
Start: 2023-02-21 — End: 2023-06-20

## 2023-02-21 MED ORDER — CEFAZOLIN SODIUM-DEXTROSE 2-4 GM/100ML-% IV SOLN
INTRAVENOUS | Status: AC
  Filled 2023-02-21: qty 100

## 2023-02-21 MED ORDER — BUPIVACAINE-EPINEPHRINE (PF) 0.25% -1:200000 IJ SOLN
INTRAMUSCULAR | Status: AC
  Filled 2023-02-21: qty 30

## 2023-02-21 MED ORDER — PROPOFOL 10 MG/ML IV BOLUS
INTRAVENOUS | Status: DC | PRN
  Administered 2023-02-21: 150 mg via INTRAVENOUS

## 2023-02-21 MED ORDER — FENTANYL CITRATE (PF) 100 MCG/2ML IJ SOLN: 25.0000 ug | INTRAMUSCULAR | Status: DC | PRN

## 2023-02-21 MED ORDER — ONDANSETRON HCL 4 MG/2ML IJ SOLN: 4.0000 mg | Freq: Once | INTRAMUSCULAR | Status: DC | PRN

## 2023-02-21 MED ORDER — LIDOCAINE HCL 1 % IJ SOLN
INTRAMUSCULAR | Status: AC
  Filled 2023-02-21: qty 20

## 2023-02-21 MED ORDER — ACETAMINOPHEN 500 MG PO TABS
1000.0000 mg | ORAL_TABLET | Freq: Once | ORAL | Status: AC
  Administered 2023-02-21: 1000 mg via ORAL
  Filled 2023-02-21: qty 2

## 2023-02-21 MED ORDER — PROPOFOL 10 MG/ML IV BOLUS
INTRAVENOUS | Status: AC
  Filled 2023-02-21: qty 20

## 2023-02-21 MED ORDER — OXYCODONE HCL 5 MG PO TABS: 5.0000 mg | ORAL_TABLET | Freq: Once | ORAL | Status: DC | PRN

## 2023-02-21 MED ORDER — PHENYLEPHRINE HCL-NACL 20-0.9 MG/250ML-% IV SOLN
INTRAVENOUS | Status: DC | PRN
  Administered 2023-02-21: 40 ug/min via INTRAVENOUS

## 2023-02-21 MED ORDER — DEXAMETHASONE SODIUM PHOSPHATE 10 MG/ML IJ SOLN
INTRAMUSCULAR | Status: DC | PRN
  Administered 2023-02-21: 5 mg via INTRAVENOUS

## 2023-02-21 MED ORDER — ONDANSETRON HCL 4 MG/2ML IJ SOLN
INTRAMUSCULAR | Status: DC | PRN
  Administered 2023-02-21: 4 mg via INTRAVENOUS

## 2023-02-21 MED ORDER — OXYCODONE HCL 5 MG/5ML PO SOLN: 5.0000 mg | Freq: Once | ORAL | Status: DC | PRN

## 2023-02-21 SURGICAL SUPPLY — 44 items
BNDG GAUZE DERMACEA FLUFF 4 (GAUZE/BANDAGES/DRESSINGS) IMPLANT
BNDG GZE DERMACEA 4 6PLY (GAUZE/BANDAGES/DRESSINGS) ×2
CANISTER SUCT 3000ML PPV (MISCELLANEOUS) ×2 IMPLANT
COVER SURGICAL LIGHT HANDLE (MISCELLANEOUS) ×2 IMPLANT
DRAPE EXTREMITY T 121X128X90 (DISPOSABLE) IMPLANT
DRAPE HALF SHEET 40X57 (DRAPES) IMPLANT
DRSG EMULSION OIL 3X3 NADH (GAUZE/BANDAGES/DRESSINGS) IMPLANT
DRSG TELFA 3X8 NADH STRL (GAUZE/BANDAGES/DRESSINGS) IMPLANT
ELECT REM PT RETURN 9FT ADLT (ELECTROSURGICAL) ×2
ELECTRODE REM PT RTRN 9FT ADLT (ELECTROSURGICAL) ×2 IMPLANT
GAUZE 4X4 16PLY ~~LOC~~+RFID DBL (SPONGE) ×2 IMPLANT
GAUZE SPONGE 4X4 12PLY STRL (GAUZE/BANDAGES/DRESSINGS) IMPLANT
GLOVE BIO SURGEON STRL SZ 6 (GLOVE) ×2 IMPLANT
GLOVE INDICATOR 6.5 STRL GRN (GLOVE) ×2 IMPLANT
GOWN STRL REUS W/ TWL LRG LVL3 (GOWN DISPOSABLE) ×2 IMPLANT
GOWN STRL REUS W/ TWL XL LVL3 (GOWN DISPOSABLE) ×2 IMPLANT
GOWN STRL REUS W/TWL LRG LVL3 (GOWN DISPOSABLE) ×2
GOWN STRL REUS W/TWL XL LVL3 (GOWN DISPOSABLE) ×2
GRAFT MYRIAD 3 LAYER 5X5 (Graft) IMPLANT
KIT BASIN OR (CUSTOM PROCEDURE TRAY) ×2 IMPLANT
KIT TURNOVER KIT B (KITS) ×2 IMPLANT
MARKER SKIN DUAL TIP RULER LAB (MISCELLANEOUS) ×2 IMPLANT
MORCELLS MYRIAD PWDR FINE 200 (Miscellaneous) IMPLANT
NDL HYPO 25GX1X1/2 BEV (NEEDLE) ×4 IMPLANT
NEEDLE HYPO 25GX1X1/2 BEV (NEEDLE) ×2 IMPLANT
NS IRRIG 1000ML POUR BTL (IV SOLUTION) ×2 IMPLANT
PACK GENERAL/GYN (CUSTOM PROCEDURE TRAY) ×2 IMPLANT
PAD ARMBOARD 7.5X6 YLW CONV (MISCELLANEOUS) ×4 IMPLANT
PENCIL SMOKE EVACUATOR (MISCELLANEOUS) ×2 IMPLANT
SOL PREP POV-IOD 4OZ 10% (MISCELLANEOUS) IMPLANT
SOL SCRUB PVP POV-IOD 4OZ 7.5% (MISCELLANEOUS) ×2
SOLUTION SCRB POV-IOD 4OZ 7.5% (MISCELLANEOUS) IMPLANT
SPECIMEN JAR SMALL (MISCELLANEOUS) ×2 IMPLANT
STRIP CLOSURE SKIN 1/2X4 (GAUZE/BANDAGES/DRESSINGS) IMPLANT
SUT ETHILON 2 0 FS 18 (SUTURE) ×2 IMPLANT
SUT MNCRL AB 4-0 PS2 18 (SUTURE) ×2 IMPLANT
SUT PROLENE 3 0 PS 2 (SUTURE) IMPLANT
SUT SILK 2 0 PERMA HAND 18 BK (SUTURE) IMPLANT
SUT VIC AB 2-0 SH 27 (SUTURE)
SUT VIC AB 2-0 SH 27XBRD (SUTURE) ×2 IMPLANT
SUT VIC AB 3-0 SH 27 (SUTURE) ×2
SUT VIC AB 3-0 SH 27X BRD (SUTURE) ×2 IMPLANT
SYR CONTROL 10ML LL (SYRINGE) ×4 IMPLANT
TOWEL GREEN STERILE FF (TOWEL DISPOSABLE) ×2 IMPLANT

## 2023-02-21 NOTE — Op Note (Addendum)
Operative Note  Patient: Regina Moody, Regina Moody MRN: [id number]  Pre-operative Diagnosis: left foot melanoma with Breslow's depth 2.5 mm, pT3bN0Mx s/p excision with positive margins for MIS.  Post-operative Diagnosis: same  Surgeon: [name], MD - Surgeon Josepha Pigg, MD - Assistant  Procedure:   Re-excision of left foot melanoma with 5 mm margins Left 5th toe amputation Coverage with skin substitute (Myriad)  Anesthesia: general  Indication: 82 year-old female with malignant melanoma of the left foot diagnosed in 12/2022, s/p excision on 6/5, with pathology significant for positive circumferential margins. Today she presents for re-excision of margins, as well as left 5th toe amputation given increasing symptoms of pain in this toe as well as proximity to positive margins and non-healing surgical wound. She understands the benefits/risks and wishes to proceed.   Description of Procedure: The patient was positioned supine on the operating table and general anesthesia was induced without complication. The correct laterality and operative site were confirmed, and the patient was prepped and draped in routine sterile fashion. A time-out was performed.  A margin of 5 mm on all sides of the previous melanoma excision site was measured, and an elliptical incision was made that completely encompassed the measured area as well as the phalanges of the 5th toe. This incision was deepened to the fascia. Skin and deep subcutaneous tissues as well as the toe specimen were completely excised off the underlying fascia using a combination of sharp dissection and electrocautery. Care was taken to raise a small flap at the fascial level at the base of the 5th toe to aid with wound closure. The specimen was then oriented with silk suture as documented in the chart and sent to pathology.  Hemostasis was ensured. The wound was partially closed on each side with interrupted and mattress 2-0 Nylon sutures.  The central open segment of the wound measured 2.4 x 1.8 cm. Skin substitute Myriad Matrix 5 x 5 cm was placed over this central segment and secured to the skin with interrupted 3-0 Vicryl sutures. Then the matrix was trimmed to size. Prior to covering the wound completely, the wound dead space was packed gently with 200 mg of Myriad Morcells Fine. Once covered completely with the matrix, a sheet of Adaptic was trimmed and secured over the matrix with interrupted 3-0 Prolene sutures. The wound was dressed with lubricant to keep it moist, Telfa, gauze, Kerlix, and Ace wrap.  All instrument, sponge, and needle counts were correct. There were no immediate postoperative complications. The patient was extubated and transported in hemodynamically stable state to the PACU.   Norberta Keens, MD  PGY-3, General Surgery   Attending Addendum:  Dr. Donell Beers was scrubbed for all key portions of the above procedure and immediately available via phone/pager throughout, and agrees with the report as written above.

## 2023-02-21 NOTE — Anesthesia Procedure Notes (Cosign Needed)
Procedure Name: LMA Insertion Date/Time: 02/21/2023 4:27 PM  Performed by: Eilene Ghazi, MDPre-anesthesia Checklist: Patient identified, Emergency Drugs available, Suction available and Patient being monitored Patient Re-evaluated:Patient Re-evaluated prior to induction Oxygen Delivery Method: Circle system utilized Preoxygenation: Pre-oxygenation with 100% oxygen Induction Type: IV induction LMA: LMA inserted LMA Size: 3.0 Dental Injury: Teeth and Oropharynx as per pre-operative assessment

## 2023-02-21 NOTE — Discharge Instructions (Addendum)
Wound care  Every day: Remove all outer dressings down to the adaptic (This is the fine mesh gauze that is sewn on with the blue sutures).  Apply surgilube GENEROUSLY, then cover with non stick gauze. Wrap with kerlix and ace wrap or Coban.    No shower unless COMPLETELY occlusive foot dressing in is place.    Walk only short distances.   [facility] Office Phone Number [phone]   POST OP INSTRUCTIONS  Always review your discharge instruction sheet given to you by the facility where your surgery was performed.  IF YOU HAVE DISABILITY OR FAMILY LEAVE FORMS, YOU MUST BRING THEM TO THE OFFICE FOR PROCESSING.  DO NOT GIVE THEM TO YOUR DOCTOR.  Take 2 tylenol (acetominophen) three times a day for 3 days.  If you still have pain, add ibuprofen with food in between if able to take this (if you have kidney issues or stomach issues, do not take ibuprofen).  If both of those are not enough, add the narcotic pain pill.  If you find you are needing a lot of this overnight after surgery, call the next morning for a refill.   Take your usually prescribed medications unless otherwise directed If you need a refill on your pain medication, please contact your pharmacy.  They will contact our office to request authorization.  Prescriptions will not be filled after 5pm or on week-ends. You should eat very light the first 24 hours after surgery, such as soup, crackers, pudding, etc.  Resume your normal diet the day after surgery It is common to experience some constipation if taking pain medication after surgery.  Increasing fluid intake and taking a stool softener will usually help or prevent this problem from occurring.  A mild laxative (Milk of Magnesia or Miralax) should be taken according to package directions if there are no bowel movements after 48 hours. ACTIVITIES:  No strenuous activity or heavy lifting for 3 weeks.   You may drive when you no longer are taking prescription pain  medication, you can comfortably wear a seatbelt, and you can safely maneuver your car and apply brakes. RETURN TO WORK:  __________n/a_______________ Regina Moody should see your doctor in the office for a follow-up appointment approximately three-four weeks after your surgery.    WHEN TO CALL YOUR DOCTOR: Fever over 101.0 Nausea and/or vomiting. Extreme swelling or bruising. Continued bleeding from incision. Increased pain, redness, or drainage from the incision.  The clinic staff is available to answer your questions during regular business hours.  Please don't hesitate to call and ask to speak to one of the nurses for clinical concerns.  If you have a medical emergency, go to the nearest emergency room or call 911.  A surgeon from [facility] is always on call at the hospital.  For further questions, please visit [email or web address]

## 2023-02-21 NOTE — Transfer of Care (Signed)
Immediate Anesthesia Transfer of Care Note  Patient: Regina Moody  Procedure(s) Performed: REEXCISION LEFT FOOT MELANOMA AND COVERAGE WITH SKIN SUBSTITUTE (Myriad) LEFT 5TH TOE AMPUTATION (Left: Toe)  Patient Location: PACU  Anesthesia Type:General  Level of Consciousness: awake, alert , and oriented  Airway & Oxygen Therapy: Patient Spontanous Breathing  Post-op Assessment: Report given to RN and Post -op Vital signs reviewed and stable  Post vital signs: Reviewed and stable  Last Vitals:  Vitals Value Taken Time  BP 130/98 02/21/23 1737  Temp    Pulse 81 02/21/23 1741  Resp 12 02/21/23 1741  SpO2 100 % 02/21/23 1741  Vitals shown include unvalidated device data.  Last Pain:  Vitals:   02/21/23 1335  TempSrc:   PainSc: 0-No pain         Complications: There were no known notable events for this encounter.

## 2023-02-21 NOTE — Interval H&P Note (Signed)
History and Physical Interval Note:  02/21/2023 3:40 PM  Regina Moody  has presented today for surgery, with the diagnosis of left foot melanoma.  The various methods of treatment have been discussed with the patient and family. After consideration of risks, benefits and other options for treatment, the patient has consented to  Procedure(s): REEXCISION LEFT FOOT MELANOMA AND COVERAGE WITH SKIN SUBSTITUTE (N/A), amputation of left 5th toe as a surgical intervention.  The patient's history has been reviewed, patient examined, no change in status, stable for surgery.  I have reviewed the patient's chart and labs.  Questions were answered to the patient's satisfaction.     [name]

## 2023-02-21 NOTE — H&P (Signed)
PROVIDER: Matthias Hughs, MD Patient Care Team: Georgann Housekeeper, MD as PCP - General (Internal Medicine)  MRN: [id number] DOB: [birth year] DATE OF ENCOUNTER: 02/06/2023 Initial History:  Patient presented with a new diagnosis of malignant melanoma of the left foot April 2024. The patient started having some pain where she was seen to have a skin lesion. She was referred to dermatology and a biopsy was performed. This demonstrated a 2.5 mm melanoma from the left plantar aspect laterally. There was ulceration but the mitotic index was 0. Peripheral margins were positive deep margin was negative regression was absent microsatellitosis and LVI were absent perineural invasion was present. The pathologic stage is at least a pT3b.  Of note, the patient is extremely healthy. She gets around a lot as well as mows her own yard with a push mower and a riding mower depending on the portion of the yard. She only takes a baby aspirin as her only medication.  She denies family history of cancer  Interval History:  Pt is s/p wide local excision with skin substitute coverage and sentinel node biopsy 01/15/2023. Margins were positive for melanoma in situ nearly circumferentially. There was residual invasive cancer, but this was all removed. 1 sentinel node was negative. Final path was pT3bN0  She has been doing well overall in terms of pain except for pain in her little toe. She has been getting dressing changed. She complains that the dressing is too thick. She has been compliant with her activity restrictions, but "is itching to get going."   Pathology 01/15/2023 A. LEFT INGUINAL SENTINEL LYMPH NODE, EXCISION: 0 /1 SENTINEL LYMPH NODE, NEGATIVE FOR MELANOMA, SEE COMMENT COMMENT: One lymph node is examined with H and E, S100, Sox-10, HMB-45 and MelanA and is negative for melanoma. This is one sentinel lymph node, negative for melanoma.  B. SKIN, LEFT FOOT, EXCISION: RESIDUAL INVASIVE AND IN-SITU  MELANOMA, INVOLVING MULIFOCAL MARGINS WITH MELANOMA IN SITU: NEARLY CIRCUMFERENTIAL INVOLVEMENT BY MELANOMA IN SITU FROM 9:30 THROUGH 12 THROUGH 6 TO 8:30 MARGIN INVOLVEMENT WITH MELANOMA IN SITU. NO INVASIVE DISEASE AT MARGIN. RESIDUAL INVASIVE DISEASE MEASURING 1.5 MM IN GREATEST DEPTH (BLOCK B1), DEEP MARGIN FREE, TABLE UPDATED FROM [facility] [phone] PT3B N0 MX MELANOMA MELANOMA TABLE (ABBREVIATED FROM [facility]): PROCEDURE: EXCISION SPECIMEN ANATOMIC SITE: LEFT PLANTAR FOREFOO OVERLYING 5TH METATARSAL HISTOLOGIC TYPE: ACRAL LENTIGINOUS BRESLOW'S DEPTH: 2.5 MM (1.5 MM REMAINING ON EXCISIONAL SPECIMEN, NO UPSTAGE) CLARK/ANATOMIC LEVEL: IV MARGINS: PERIPHERAL: BLOCKS B1, B3, B3, B4 INVOLVE MELANOMA IN SITU AT GREEN INK CIRCUMFERENTIALLY AND ORANGE INK BLOCKS B1, B2, B3 DEEP: FREE ULCERATION: PRESENT SATELLITOSIS: ABSENT MITOTIC INDEX: 0/MM2 LYMPHOVASCULAR INVASION: ABSENT NEUTROTROPISM: ABSENT TUMOR-INFILTRATING LYMPHOCYTES: ABSENT IN EXCISION SPECIMEN, NOT REPORTED IN PRIMARY REGRESSION: ABSENT LYMPH NODES: 0/1 SLN, NEGATIVE FOR MELANOMA PATHOLOGIC STAGE: PT3B N0 MX  Physical Examination:  Left foot dressing removed including sorbac. There is good fill in of tissue beneath the myriad matrix. No evidence of infection.  Small left groin hematoma below the inguinal ligament at the incision  Assessment and Plan:  Diagnoses and all orders for this visit:  Melanoma of foot, left (CMS/HHS-HCC)  I will plan reexcision for the positive margins. I think we can try her 1 reexcision without amputating her fifth toe. However, if her margins are still positive in this location, I will need to do toe amputation. I discussed this with the patient and her friend. I will try to disturb the new growth of skin as little as possible. I will however  need to take a circumferential rim around the previous excision.  I do not think I can aspirate the hematoma in the left  groin as it still feels clotted. I will readdress this at the time of reexcision. If it feels like it is liquefied, I would try to aspirated at that time. She is not significantly bothered by this, so I will wait.  No follow-ups on file.  The plan was discussed in detail with the patient today, who expressed understanding. The patient has my contact information, and understands to call me with any additional questions or concerns in the interval. I would be happy to see the patient back sooner if the need arises.

## 2023-02-22 ENCOUNTER — Encounter (HOSPITAL_COMMUNITY): Payer: Self-pay | Admitting: General Surgery

## 2023-02-24 NOTE — Anesthesia Postprocedure Evaluation (Signed)
Anesthesia Post Note  Patient: Regina Moody  Procedure(s) Performed: REEXCISION LEFT FOOT MELANOMA AND COVERAGE WITH SKIN SUBSTITUTE ([facility]) LEFT 5TH TOE AMPUTATION (Left: Toe)     Patient location during evaluation: PACU Anesthesia Type: General Level of consciousness: patient cooperative and awake Pain management: pain level controlled Vital Signs Assessment: post-procedure vital signs reviewed and stable Respiratory status: spontaneous breathing, nonlabored ventilation, respiratory function stable and patient connected to nasal cannula oxygen Cardiovascular status: blood pressure returned to baseline and stable Postop Assessment: no apparent nausea or vomiting Anesthetic complications: no   There were no known notable events for this encounter.  Last Vitals:  Vitals:   02/21/23 1800 02/21/23 1815  BP: (!) 144/51 (!) 149/58  Pulse: 66 69  Resp: 10 13  Temp:  36.7 C  SpO2: 100% 100%    Last Pain:  Vitals:   02/21/23 1745  TempSrc:   PainSc: 0-No pain                 [name]

## 2023-02-28 ENCOUNTER — Encounter (HOSPITAL_COMMUNITY): Payer: Self-pay | Admitting: General Surgery

## 2023-02-28 LAB — SURGICAL PATHOLOGY

## 2023-03-01 ENCOUNTER — Telehealth: Payer: Self-pay | Admitting: General Surgery

## 2023-03-01 NOTE — Telephone Encounter (Signed)
Discussed path with patient. Will try aldara after wound healed for MIS.

## 2023-04-12 ENCOUNTER — Other Ambulatory Visit: Payer: Self-pay | Admitting: General Surgery

## 2023-04-12 DIAGNOSIS — D0372 Melanoma in situ of left lower limb, including hip: Secondary | ICD-10-CM | POA: Diagnosis not present

## 2023-04-15 DIAGNOSIS — Z85820 Personal history of malignant melanoma of skin: Secondary | ICD-10-CM | POA: Diagnosis not present

## 2023-04-15 DIAGNOSIS — D225 Melanocytic nevi of trunk: Secondary | ICD-10-CM | POA: Diagnosis not present

## 2023-04-15 DIAGNOSIS — L814 Other melanin hyperpigmentation: Secondary | ICD-10-CM | POA: Diagnosis not present

## 2023-04-15 DIAGNOSIS — L821 Other seborrheic keratosis: Secondary | ICD-10-CM | POA: Diagnosis not present

## 2023-04-15 DIAGNOSIS — Z08 Encounter for follow-up examination after completed treatment for malignant neoplasm: Secondary | ICD-10-CM | POA: Diagnosis not present

## 2023-04-15 DIAGNOSIS — I872 Venous insufficiency (chronic) (peripheral): Secondary | ICD-10-CM | POA: Diagnosis not present

## 2023-04-16 DIAGNOSIS — Z0000 Encounter for general adult medical examination without abnormal findings: Secondary | ICD-10-CM | POA: Diagnosis not present

## 2023-04-16 DIAGNOSIS — G43909 Migraine, unspecified, not intractable, without status migrainosus: Secondary | ICD-10-CM | POA: Diagnosis not present

## 2023-04-16 DIAGNOSIS — I700 Atherosclerosis of aorta: Secondary | ICD-10-CM | POA: Diagnosis not present

## 2023-04-16 DIAGNOSIS — M810 Age-related osteoporosis without current pathological fracture: Secondary | ICD-10-CM | POA: Diagnosis not present

## 2023-04-16 DIAGNOSIS — Z89422 Acquired absence of other left toe(s): Secondary | ICD-10-CM | POA: Diagnosis not present

## 2023-04-16 DIAGNOSIS — R21 Rash and other nonspecific skin eruption: Secondary | ICD-10-CM | POA: Diagnosis not present

## 2023-04-16 DIAGNOSIS — Z1331 Encounter for screening for depression: Secondary | ICD-10-CM | POA: Diagnosis not present

## 2023-04-16 DIAGNOSIS — C439 Malignant melanoma of skin, unspecified: Secondary | ICD-10-CM | POA: Diagnosis not present

## 2023-04-16 DIAGNOSIS — I739 Peripheral vascular disease, unspecified: Secondary | ICD-10-CM | POA: Diagnosis not present

## 2023-04-19 ENCOUNTER — Telehealth: Payer: Self-pay | Admitting: General Surgery

## 2023-04-19 NOTE — Telephone Encounter (Signed)
Left message on machine about pathology. Will discuss in detail next week at appointment.

## 2023-04-24 ENCOUNTER — Telehealth: Payer: Self-pay | Admitting: Medical Oncology

## 2023-04-24 NOTE — Telephone Encounter (Signed)
New Referral appt on Monday - referred by Dr Georgann Housekeeper.   DX-Melanoma of left foot w positive margins. Then in June  had left fifth toe amputation for same.  Pt asking if [facility] is the right provider to see melanoma pts.

## 2023-04-25 ENCOUNTER — Telehealth: Payer: Self-pay | Admitting: Medical Oncology

## 2023-04-25 NOTE — Telephone Encounter (Signed)
I told pt that Dr Donell Beers will tell her if she needs to see Dr. Arbutus Ped on Monday.

## 2023-04-25 NOTE — Telephone Encounter (Signed)
Spoke to [place] at Dr Donell Beers -Does pt need med onc appt Monday.?  Dr. Donell Beers will call tomorrow after she sees pt.

## 2023-04-26 ENCOUNTER — Telehealth: Payer: Self-pay | Admitting: *Deleted

## 2023-04-26 NOTE — Telephone Encounter (Signed)
Pt left message to cancel appts for Monday

## 2023-04-29 ENCOUNTER — Ambulatory Visit: Payer: HMO | Admitting: Internal Medicine

## 2023-04-29 ENCOUNTER — Other Ambulatory Visit: Payer: HMO

## 2023-05-01 DIAGNOSIS — D3132 Benign neoplasm of left choroid: Secondary | ICD-10-CM | POA: Diagnosis not present

## 2023-05-01 DIAGNOSIS — Z961 Presence of intraocular lens: Secondary | ICD-10-CM | POA: Diagnosis not present

## 2023-05-17 DIAGNOSIS — D0372 Melanoma in situ of left lower limb, including hip: Secondary | ICD-10-CM | POA: Insufficient documentation

## 2023-06-06 DIAGNOSIS — C4372 Malignant melanoma of left lower limb, including hip: Secondary | ICD-10-CM | POA: Diagnosis not present

## 2023-06-06 DIAGNOSIS — D0372 Melanoma in situ of left lower limb, including hip: Secondary | ICD-10-CM | POA: Diagnosis not present

## 2023-06-18 ENCOUNTER — Other Ambulatory Visit: Payer: Self-pay | Admitting: General Surgery

## 2023-06-20 ENCOUNTER — Encounter (HOSPITAL_BASED_OUTPATIENT_CLINIC_OR_DEPARTMENT_OTHER): Payer: Self-pay | Admitting: General Surgery

## 2023-06-21 DIAGNOSIS — C4372 Malignant melanoma of left lower limb, including hip: Secondary | ICD-10-CM | POA: Diagnosis not present

## 2023-06-21 DIAGNOSIS — Z89422 Acquired absence of other left toe(s): Secondary | ICD-10-CM | POA: Diagnosis not present

## 2023-06-21 DIAGNOSIS — M1990 Unspecified osteoarthritis, unspecified site: Secondary | ICD-10-CM | POA: Diagnosis not present

## 2023-06-21 DIAGNOSIS — G8929 Other chronic pain: Secondary | ICD-10-CM | POA: Diagnosis not present

## 2023-06-21 DIAGNOSIS — Z7982 Long term (current) use of aspirin: Secondary | ICD-10-CM | POA: Diagnosis not present

## 2023-06-21 DIAGNOSIS — Z483 Aftercare following surgery for neoplasm: Secondary | ICD-10-CM | POA: Diagnosis not present

## 2023-06-27 ENCOUNTER — Ambulatory Visit (HOSPITAL_BASED_OUTPATIENT_CLINIC_OR_DEPARTMENT_OTHER): Payer: HMO | Admitting: Anesthesiology

## 2023-06-27 ENCOUNTER — Ambulatory Visit (HOSPITAL_BASED_OUTPATIENT_CLINIC_OR_DEPARTMENT_OTHER)
Admission: RE | Admit: 2023-06-27 | Discharge: 2023-06-27 | Disposition: A | Discharge status: Home / Self Care | Payer: HMO | Attending: General Surgery | Admitting: General Surgery

## 2023-06-27 ENCOUNTER — Encounter (HOSPITAL_BASED_OUTPATIENT_CLINIC_OR_DEPARTMENT_OTHER): Payer: Self-pay | Admitting: General Surgery

## 2023-06-27 ENCOUNTER — Other Ambulatory Visit: Payer: Self-pay

## 2023-06-27 ENCOUNTER — Encounter (HOSPITAL_BASED_OUTPATIENT_CLINIC_OR_DEPARTMENT_OTHER)
Admission: RE | Disposition: A | Discharge status: Home / Self Care | Payer: Self-pay | Source: Home / Self Care | Attending: General Surgery

## 2023-06-27 DIAGNOSIS — C4372 Malignant melanoma of left lower limb, including hip: Secondary | ICD-10-CM | POA: Diagnosis not present

## 2023-06-27 DIAGNOSIS — Z01818 Encounter for other preprocedural examination: Secondary | ICD-10-CM

## 2023-06-27 HISTORY — PX: APLIGRAFT PLACEMENT: SHX5228

## 2023-06-27 SURGERY — APLIGRAFT PLACEMENT
Anesthesia: Monitor Anesthesia Care | Site: Foot | Laterality: Left

## 2023-06-27 MED ORDER — AMISULPRIDE (ANTIEMETIC) 5 MG/2ML IV SOLN: 10.0000 mg | Freq: Once | INTRAVENOUS | Status: DC | PRN

## 2023-06-27 MED ORDER — 0.9 % SODIUM CHLORIDE (POUR BTL) OPTIME
TOPICAL | Status: DC | PRN
  Administered 2023-06-27: 100 mL

## 2023-06-27 MED ORDER — EPHEDRINE 5 MG/ML INJ
INTRAVENOUS | Status: AC
  Filled 2023-06-27: qty 5

## 2023-06-27 MED ORDER — FENTANYL CITRATE (PF) 100 MCG/2ML IJ SOLN
INTRAMUSCULAR | Status: AC
  Filled 2023-06-27: qty 2

## 2023-06-27 MED ORDER — PHENYLEPHRINE 80 MCG/ML (10ML) SYRINGE FOR IV PUSH (FOR BLOOD PRESSURE SUPPORT)
PREFILLED_SYRINGE | INTRAVENOUS | Status: AC
  Filled 2023-06-27: qty 10

## 2023-06-27 MED ORDER — FENTANYL CITRATE (PF) 100 MCG/2ML IJ SOLN
100.0000 ug | Freq: Once | INTRAMUSCULAR | Status: AC
  Administered 2023-06-27: 75 ug via INTRAVENOUS

## 2023-06-27 MED ORDER — PROPOFOL 500 MG/50ML IV EMUL
INTRAVENOUS | Status: DC | PRN
  Administered 2023-06-27: 100 ug/kg/min via INTRAVENOUS

## 2023-06-27 MED ORDER — LIDOCAINE HCL (PF) 1 % IJ SOLN
INTRAMUSCULAR | Status: AC
  Filled 2023-06-27: qty 30

## 2023-06-27 MED ORDER — LIDOCAINE 2% (20 MG/ML) 5 ML SYRINGE
INTRAMUSCULAR | Status: AC
  Filled 2023-06-27: qty 5

## 2023-06-27 MED ORDER — BUPIVACAINE-EPINEPHRINE (PF) 0.25% -1:200000 IJ SOLN
INTRAMUSCULAR | Status: AC
  Filled 2023-06-27: qty 30

## 2023-06-27 MED ORDER — OXYCODONE HCL 5 MG PO TABS: 5.0000 mg | ORAL_TABLET | Freq: Once | ORAL | Status: DC | PRN

## 2023-06-27 MED ORDER — MIDAZOLAM HCL 2 MG/2ML IJ SOLN
INTRAMUSCULAR | Status: AC
  Filled 2023-06-27: qty 2

## 2023-06-27 MED ORDER — LIDOCAINE HCL (PF) 1 % IJ SOLN
INTRAMUSCULAR | Status: DC | PRN
  Administered 2023-06-27: 3.5 mL

## 2023-06-27 MED ORDER — BUPIVACAINE HCL (PF) 0.25 % IJ SOLN
INTRAMUSCULAR | Status: AC
Start: 2023-06-27 — End: ?
  Filled 2023-06-27: qty 30

## 2023-06-27 MED ORDER — LACTATED RINGERS IV SOLN: INTRAVENOUS | Status: DC

## 2023-06-27 MED ORDER — ONDANSETRON HCL 4 MG/2ML IJ SOLN
INTRAMUSCULAR | Status: DC | PRN
  Administered 2023-06-27: 4 mg via INTRAVENOUS

## 2023-06-27 MED ORDER — LIDOCAINE 2% (20 MG/ML) 5 ML SYRINGE
INTRAMUSCULAR | Status: DC | PRN
  Administered 2023-06-27: 20 mg via INTRAVENOUS

## 2023-06-27 MED ORDER — MEPIVACAINE HCL (PF) 2 % IJ SOLN
INTRAMUSCULAR | Status: DC | PRN
  Administered 2023-06-27: 20 mL

## 2023-06-27 MED ORDER — CHLORHEXIDINE GLUCONATE CLOTH 2 % EX PADS: 6.0000 | MEDICATED_PAD | Freq: Once | CUTANEOUS | Status: DC

## 2023-06-27 MED ORDER — FENTANYL CITRATE (PF) 100 MCG/2ML IJ SOLN: 25.0000 ug | INTRAMUSCULAR | Status: DC | PRN

## 2023-06-27 MED ORDER — OXYCODONE HCL 5 MG PO TABS: 5.0000 mg | ORAL_TABLET | Freq: Four times a day (QID) | ORAL | 0 refills | Status: DC | PRN

## 2023-06-27 MED ORDER — OXYCODONE HCL 5 MG/5ML PO SOLN: 5.0000 mg | Freq: Once | ORAL | Status: DC | PRN

## 2023-06-27 MED ORDER — ONDANSETRON HCL 4 MG/2ML IJ SOLN
INTRAMUSCULAR | Status: AC
  Filled 2023-06-27: qty 2

## 2023-06-27 MED ORDER — FENTANYL CITRATE (PF) 100 MCG/2ML IJ SOLN
INTRAMUSCULAR | Status: DC | PRN
  Administered 2023-06-27: 25 ug via INTRAVENOUS

## 2023-06-27 MED ORDER — ACETAMINOPHEN 500 MG PO TABS
1000.0000 mg | ORAL_TABLET | ORAL | Status: AC
  Administered 2023-06-27: 500 mg via ORAL

## 2023-06-27 MED ORDER — ACETAMINOPHEN 500 MG PO TABS
ORAL_TABLET | ORAL | Status: AC
  Filled 2023-06-27: qty 1

## 2023-06-27 MED ORDER — CEFAZOLIN SODIUM-DEXTROSE 2-4 GM/100ML-% IV SOLN
2.0000 g | INTRAVENOUS | Status: AC
  Administered 2023-06-27: 2 g via INTRAVENOUS

## 2023-06-27 MED ORDER — SUCCINYLCHOLINE CHLORIDE 200 MG/10ML IV SOSY
PREFILLED_SYRINGE | INTRAVENOUS | Status: AC
  Filled 2023-06-27: qty 10

## 2023-06-27 MED ORDER — BUPIVACAINE-EPINEPHRINE (PF) 0.25% -1:200000 IJ SOLN
INTRAMUSCULAR | Status: DC | PRN
  Administered 2023-06-27: 3.5 mL

## 2023-06-27 MED ORDER — ROPIVACAINE HCL 5 MG/ML IJ SOLN
INTRAMUSCULAR | Status: DC | PRN
  Administered 2023-06-27: 10 mL

## 2023-06-27 SURGICAL SUPPLY — 72 items
ADH SKN CLS APL DERMABOND .7 (GAUZE/BANDAGES/DRESSINGS)
APL PRP STRL LF DISP 70% ISPRP (MISCELLANEOUS)
APL SKNCLS STERI-STRIP NONHPOA (GAUZE/BANDAGES/DRESSINGS)
BENZOIN TINCTURE PRP APPL 2/3 (GAUZE/BANDAGES/DRESSINGS) IMPLANT
BLADE HEX COATED 2.75 (ELECTRODE) ×1 IMPLANT
BLADE SURG 10 STRL SS (BLADE) ×1 IMPLANT
BLADE SURG 15 STRL LF DISP TIS (BLADE) ×1 IMPLANT
BLADE SURG 15 STRL SS (BLADE) ×1
BNDG CMPR 5X4 CHSV STRCH STRL (GAUZE/BANDAGES/DRESSINGS)
BNDG CMPR 5X4 KNIT ELC UNQ LF (GAUZE/BANDAGES/DRESSINGS) ×1
BNDG CMPR 75X21 PLY HI ABS (MISCELLANEOUS) ×1
BNDG COHESIVE 4X5 TAN STRL LF (GAUZE/BANDAGES/DRESSINGS) IMPLANT
BNDG ELASTIC 4INX 5YD STR LF (GAUZE/BANDAGES/DRESSINGS) IMPLANT
CANISTER SUCT 1200ML W/VALVE (MISCELLANEOUS) IMPLANT
CHLORAPREP W/TINT 26 (MISCELLANEOUS) ×1 IMPLANT
CLIP TI MEDIUM 6 (CLIP) IMPLANT
COVER BACK TABLE 60X90IN (DRAPES) IMPLANT
COVER MAYO STAND STRL (DRAPES) IMPLANT
COVER PROBE CYLINDRICAL 5X96 (MISCELLANEOUS) IMPLANT
DERMABOND ADVANCED .7 DNX12 (GAUZE/BANDAGES/DRESSINGS) ×1 IMPLANT
DRAPE EXTREMITY T 121X128X90 (DISPOSABLE) IMPLANT
DRAPE IMP U-DRAPE 54X76 (DRAPES) IMPLANT
DRAPE LAPAROTOMY 100X72 PEDS (DRAPES) IMPLANT
DRAPE UTILITY XL STRL (DRAPES) ×1 IMPLANT
DRSG CUTIMED SORBACT 7X9 (GAUZE/BANDAGES/DRESSINGS) IMPLANT
DRSG TELFA 3X8 NADH STRL (GAUZE/BANDAGES/DRESSINGS) IMPLANT
ELECT REM PT RETURN 9FT ADLT (ELECTROSURGICAL) ×1
ELECTRODE REM PT RTRN 9FT ADLT (ELECTROSURGICAL) ×1 IMPLANT
GAUZE SPONGE 4X4 12PLY STRL (GAUZE/BANDAGES/DRESSINGS) IMPLANT
GAUZE SPONGE 4X4 12PLY STRL LF (GAUZE/BANDAGES/DRESSINGS) ×1 IMPLANT
GAUZE STRETCH 2X75IN STRL (MISCELLANEOUS) IMPLANT
GLOVE BIO SURGEON STRL SZ 6 (GLOVE) ×1 IMPLANT
GLOVE BIOGEL PI IND STRL 6.5 (GLOVE) ×1 IMPLANT
GOWN STRL REUS W/ TWL LRG LVL3 (GOWN DISPOSABLE) ×1 IMPLANT
GOWN STRL REUS W/ TWL XL LVL3 (GOWN DISPOSABLE) ×1 IMPLANT
GOWN STRL REUS W/TWL LRG LVL3 (GOWN DISPOSABLE) ×3
GOWN STRL REUS W/TWL XL LVL3 (GOWN DISPOSABLE) ×1
GRAFT MYRIAD 3 LAYER 7X10 (Graft) IMPLANT
NDL HYPO 25X1 1.5 SAFETY (NEEDLE) ×1 IMPLANT
NDL SAFETY ECLIPSE 18X1.5 (NEEDLE) IMPLANT
NEEDLE HYPO 25X1 1.5 SAFETY (NEEDLE) ×1 IMPLANT
NS IRRIG 1000ML POUR BTL (IV SOLUTION) IMPLANT
PACK BASIN DAY SURGERY FS (CUSTOM PROCEDURE TRAY) ×1 IMPLANT
PACK UNIVERSAL I (CUSTOM PROCEDURE TRAY) IMPLANT
PENCIL SMOKE EVACUATOR (MISCELLANEOUS) ×1 IMPLANT
POWDER MYRIAD MORCLLS FINE 500 (Miscellaneous) IMPLANT
PWDR MYRIAD MORCELLS FINE 500 (Miscellaneous) ×1 IMPLANT
SLEEVE SCD COMPRESS KNEE MED (STOCKING) IMPLANT
SPIKE FLUID TRANSFER (MISCELLANEOUS) IMPLANT
SPONGE T-LAP 18X18 ~~LOC~~+RFID (SPONGE) ×1 IMPLANT
STAPLER SKIN PROX WIDE 3.9 (STAPLE) IMPLANT
STOCKINETTE IMPERVIOUS LG (DRAPES) IMPLANT
STRIP CLOSURE SKIN 1/2X4 (GAUZE/BANDAGES/DRESSINGS) IMPLANT
SUT ETHILON 2 0 FS 18 (SUTURE) IMPLANT
SUT MNCRL AB 4-0 PS2 18 (SUTURE) ×1 IMPLANT
SUT PROLENE 3 0 PS 2 (SUTURE) IMPLANT
SUT SILK 2 0 SH (SUTURE) ×1 IMPLANT
SUT SILK 3 0 TIES 17X18 (SUTURE)
SUT SILK 3-0 18XBRD TIE BLK (SUTURE) IMPLANT
SUT VIC AB 2-0 SH 27 (SUTURE)
SUT VIC AB 2-0 SH 27XBRD (SUTURE) IMPLANT
SUT VIC AB 3-0 SH 27 (SUTURE)
SUT VIC AB 3-0 SH 27X BRD (SUTURE) ×1 IMPLANT
SUT VIC AB 4-0 PS2 18 (SUTURE) ×1 IMPLANT
SUT VIC AB 4-0 RB1 27 (SUTURE) ×4
SUT VIC AB 4-0 RB1 27X BRD (SUTURE) IMPLANT
SYR CONTROL 10ML LL (SYRINGE) ×1 IMPLANT
SYR TB 1ML LL NO SAFETY (SYRINGE) ×1 IMPLANT
TOWEL GREEN STERILE FF (TOWEL DISPOSABLE) ×1 IMPLANT
TRAY DSU PREP LF (CUSTOM PROCEDURE TRAY) IMPLANT
TUBE CONNECTING 20X1/4 (TUBING) IMPLANT
YANKAUER SUCT BULB TIP NO VENT (SUCTIONS) IMPLANT

## 2023-06-27 NOTE — Interval H&P Note (Signed)
History and Physical Interval Note:  06/27/2023 1:57 PM  Regina Moody  has presented today for surgery, with the diagnosis of OPEN LEFT FOOT WOUND, LEFT FOOT MELANOMA.  The various methods of treatment have been discussed with the patient and family. After consideration of risks, benefits and other options for treatment, the patient has consented to  Procedure(s): PLACEMENT OF SKIN SUBSTITUTE LEFT FOOT WOUND (Left) as a surgical intervention.  The patient's history has been reviewed, patient examined, no change in status, stable for surgery.  I have reviewed the patient's chart and labs.  Questions were answered to the patient's satisfaction.     [name]

## 2023-06-27 NOTE — Progress Notes (Signed)
Assisted Dr. Rose with left, ankle block. Side rails up, monitors on throughout procedure. See vital signs in flow sheet. Tolerated Procedure well. 

## 2023-06-27 NOTE — Plan of Care (Signed)
[facility] Tonsillectomy/Adenoidectomy, Postoperative PEDS care plan entered in error.

## 2023-06-27 NOTE — Anesthesia Procedure Notes (Signed)
Anesthesia Regional Block: Ankle block   Pre-Anesthetic Checklist: , timeout performed,  Correct Patient, Correct Site, Correct Laterality,  Correct Procedure, Correct Position, site marked,  Risks and benefits discussed,  Surgical consent,  Pre-op evaluation,  At surgeon's request and post-op pain management  Laterality: Left  Prep: chloraprep       Needles:  Injection technique: Single-shot  Needle Type: Other     Needle Length: 5cm  Needle Gauge: 25     Additional Needles:   Procedures:,,,, ultrasound used (permanent image in chart),,    Narrative:  Start time: 06/27/2023 12:45 PM End time: 06/27/2023 12:50 PM Injection made incrementally with aspirations every 5 mL.  Performed by: Personally  Anesthesiologist: Eilene Ghazi, MD  Additional Notes: Patient tolerated the procedure well without complications

## 2023-06-27 NOTE — Anesthesia Preprocedure Evaluation (Addendum)
Anesthesia Evaluation  Patient identified by MRN, date of birth, ID band Patient awake    Reviewed: Allergy & Precautions, H&P , NPO status , Patient's Chart, lab work & pertinent test results  Airway Mallampati: II  TM Distance: >3 FB Neck ROM: Full    Dental no notable dental hx.    Pulmonary neg pulmonary ROS   Pulmonary exam normal breath sounds clear to auscultation       Cardiovascular negative cardio ROS Normal cardiovascular exam Rhythm:Regular Rate:Normal     Neuro/Psych  Headaches negative neurological ROS  negative psych ROS   GI/Hepatic negative GI ROS, Neg liver ROS,,,  Endo/Other  negative endocrine ROS    Renal/GU negative Renal ROS  negative genitourinary   Musculoskeletal negative musculoskeletal ROS (+)  Osteoporosis    Abdominal   Peds negative pediatric ROS (+)  Hematology negative hematology ROS (+)   Anesthesia Other Findings Melanoma   Reproductive/Obstetrics negative OB ROS                             Anesthesia Physical Anesthesia Plan  ASA: 2  Anesthesia Plan: MAC   Post-op Pain Management: Regional block*   Induction: Intravenous  PONV Risk Score and Plan: 2 and Propofol infusion, Treatment may vary due to age or medical condition and Ondansetron  Airway Management Planned: Simple Face Mask  Additional Equipment:   Intra-op Plan:   Post-operative Plan:   Informed Consent: I have reviewed the patients History and Physical, chart, labs and discussed the procedure including the risks, benefits and alternatives for the proposed anesthesia with the patient or authorized representative who has indicated his/her understanding and acceptance.     Dental advisory given  Plan Discussed with: CRNA and Surgeon  Anesthesia Plan Comments:         Anesthesia Quick Evaluation

## 2023-06-27 NOTE — Transfer of Care (Signed)
Immediate Anesthesia Transfer of Care Note  Patient: Regina Moody  Procedure(s) Performed: PLACEMENT OF SKIN SUBSTITUTE LEFT FOOT WOUND (Left: Foot)  Patient Location: PACU  Anesthesia Type:MAC combined with regional for post-op pain  Level of Consciousness: awake, alert , drowsy, and patient cooperative  Airway & Oxygen Therapy: Patient Spontanous Breathing and Patient connected to face mask oxygen  Post-op Assessment: Report given to RN and Post -op Vital signs reviewed and stable  Post vital signs: Reviewed and stable  Last Vitals:  Vitals Value Taken Time  BP    Temp    Pulse 90 06/27/23 1505  Resp 16 06/27/23 1505  SpO2 96 % 06/27/23 1505  Vitals shown include unfiled device data.  Last Pain:  Vitals:   06/27/23 1255  TempSrc:   PainSc: 4       Patients Stated Pain Goal: 8 (06/27/23 1208)  Complications: No notable events documented.

## 2023-06-27 NOTE — H&P (Signed)
PROVIDER: Matthias Hughs, MD Patient Care Team: Georgann Housekeeper, MD as PCP - General (Internal Medicine) Wynona Canes, MD (Dermatology)  MRN: [id number] DOB: 1940/12/15 DATE OF ENCOUNTER: 06/18/2023 Initial History:   Patient presented with a new diagnosis of malignant melanoma of the left foot April 2024. The patient started having some pain where she was seen to have a skin lesion. She was referred to dermatology and a biopsy was performed. This demonstrated a 2.5 mm melanoma from the left plantar aspect laterally. There was ulceration but the mitotic index was 0. Peripheral margins were positive deep margin was negative regression was absent microsatellitosis and LVI were absent perineural invasion was present. The pathologic stage is at least a pT3b.  Of note, the patient is extremely healthy. She gets around a lot as well as mows her own yard with a push mower and a riding mower depending on the portion of the yard. She only takes a baby aspirin as her only medication.  She denies family history of cancer  Interval History:   Pt is s/p wide local excision with skin substitute coverage and sentinel node biopsy 01/15/2023. Margins were positive for melanoma in situ nearly circumferentially. There was residual invasive cancer, but this was all removed. 1 sentinel node was negative. Final path was pT3bN0  Pt went back to the OR 6/20 for reexcision including left 5th toe amputation. Margin was still positive for MIS, but less extensive. Pt was having a lot of pain this time after the amputation. She also had a sensation of the dorsum of her foot being cold despite it feeling warm to touch. I prescribed doxycycline.   Pt saw [facility] 8/9 for concerns of a new pigmented lesion on her foot as well as a rash. [facility] did punch biopsy on new pigmented region and this was MIS.   Patient did did go to the skin surgery center and Dr. Jeannine Boga did Mohs on her to eradicate the remainder of the melanoma  in situ. This did leave a relatively large wound.  Path 04/12/2023 Skin , left foot lesion MALIGNANT MELANOMA IN SITU, PERIPHERAL MARGIN INVOLVED, SEE DESCRIPTION Microscopic Description There is a proliferation of atypical melanocytes arranged as solitary units and in nests at the dermal epidermal junction with scattered atypical melanocytes throughout the upper reaches of the epidermis. Where nests are present, they tend to confluence. The dermis contains an inflammatory cell infiltrate and melanophages, but no diagnostic invasive disease is seen. The findings are those of a melanoma in situ. Following the review of H&E sections, Melan-A and SOX-10 stains were performed to highlight the melanocytes in this lesion. There is positive diffuse immunoreactivity for PRAME, which is consistent with this diagnosis. The lesion extends to the peripheral margin of the specimen.  Pathology 01/15/2023 A. LEFT INGUINAL SENTINEL LYMPH NODE, EXCISION: 0 /1 SENTINEL LYMPH NODE, NEGATIVE FOR MELANOMA, SEE COMMENT COMMENT: One lymph node is examined with H and E, S100, Sox-10, HMB-45 and MelanA and is negative for melanoma. This is one sentinel lymph node, negative for melanoma.  B. SKIN, LEFT FOOT, EXCISION: RESIDUAL INVASIVE AND IN-SITU MELANOMA, INVOLVING MULIFOCAL MARGINS WITH MELANOMA IN SITU: NEARLY CIRCUMFERENTIAL INVOLVEMENT BY MELANOMA IN SITU FROM 9:30 THROUGH 12 THROUGH 6 TO 8:30 MARGIN INVOLVEMENT WITH MELANOMA IN SITU. NO INVASIVE DISEASE AT MARGIN. RESIDUAL INVASIVE DISEASE MEASURING 1.5 MM IN GREATEST DEPTH (BLOCK B1), DEEP MARGIN FREE, TABLE UPDATED FROM [facility] [phone] PT3B N0 MX MELANOMA MELANOMA TABLE (ABBREVIATED FROM [facility]ERY  [facility]ENTER): PROCEDURE: EXCISION SPECIMEN ANATOMIC SITE: LEFT PLANTAR FOREFOO OVERLYING 5TH METATARSAL HISTOLOGIC TYPE: ACRAL LENTIGINOUS BRESLOW'S DEPTH: 2.5 MM (1.5 MM REMAINING ON EXCISIONAL SPECIMEN, NO UPSTAGE) CLARK/ANATOMIC LEVEL:  IV MARGINS: PERIPHERAL: BLOCKS B1, B3, B3, B4 INVOLVE MELANOMA IN SITU AT GREEN INK CIRCUMFERENTIALLY AND ORANGE INK BLOCKS B1, B2, B3 DEEP: FREE ULCERATION: PRESENT SATELLITOSIS: ABSENT MITOTIC INDEX: 0/MM2 LYMPHOVASCULAR INVASION: ABSENT NEUTROTROPISM: ABSENT TUMOR-INFILTRATING LYMPHOCYTES: ABSENT IN EXCISION SPECIMEN, NOT REPORTED IN PRIMARY REGRESSION: ABSENT LYMPH NODES: 0/1 SLN, NEGATIVE FOR MELANOMA PATHOLOGIC STAGE: PT3B N0 MX   Physical Examination:   Left foot wound is larger, however it is much more shallow than I anticipated. There really is only the single layer of skin that needs to grow. The deeper portion has filled in and is nearly flush with the adjacent skin. There is nice beefy red granulation tissue and no evidence of infection. The wound is approximately 4 x 4 cm.  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 I discussed the possibilities of letting this heal and on its own with just dressing changes, placing another piece of skin substitute, or doing a skin graft. I do not favor skin graft due to the location with the vast majority of this being on the sole of the foot. We are going to schedule her for placement of skin substitute, however if she has significant healing between now and the time of surgery, we will consider canceling it. I do not think that this is going to occur just because of the size of the defect. If anesthesia is amenable, we may be able to do this with a block and with sedation rather than general anesthesia.  Continue dressing changes for the foot.   No follow-ups on  file.  The plan was discussed in detail with the patient today, who expressed understanding. The patient has my contact information, and understands to call me with any additional questions or concerns in the interval. I would be happy to see the patient back sooner if the need arises.

## 2023-06-27 NOTE — Interval H&P Note (Signed)
History and Physical Interval Note:  06/27/2023 12:43 PM  Regina Moody  has presented today for surgery, with the diagnosis of OPEN LEFT FOOT WOUND, LEFT FOOT MELANOMA.  The various methods of treatment have been discussed with the patient and family. After consideration of risks, benefits and other options for treatment, the patient has consented to  Procedure(s): PLACEMENT OF SKIN SUBSTITUTE LEFT FOOT WOUND (Left) as a surgical intervention.  The patient's history has been reviewed, patient examined, no change in status, stable for surgery.  I have reviewed the patient's chart and labs.  Questions were answered to the patient's satisfaction.     [name]

## 2023-06-27 NOTE — Op Note (Signed)
PRE-OPERATIVE DIAGNOSIS: melanoma left foot, s/p WLE, reexcision with toe amputation, reexcision with mohs.  Path pT3bN0  POST-OPERATIVE DIAGNOSIS:  Same  PROCEDURE:  Procedure(s): Coverage of open wound 5.4 x 5.7 cm  SURGEON:  Surgeon(s): [name], MD  ANESTHESIA:   regional and MAC  DRAINS: none   LOCAL MEDICATIONS USED:  BUPIVICAINE  and LIDOCAINE   SPECIMEN:  No Specimen  DISPOSITION OF SPECIMEN:  N/A  COUNTS:  YES  DICTATION: .Dragon Dictation  PLAN OF CARE: Discharge to home after PACU  PATIENT DISPOSITION:  PACU - hemodynamically stable.  FINDINGS:  granulating wound  EBL: min  PROCEDURE:   Patient was identified in the holding area taken operating room where she was placed supine on the table.  MAC anesthesia was induced.  The left foot was prepped with Betadine and draped.  The timeout was performed according to the surgical safety checklist.  When all was correct, we continued.  The previous wound was measured and it was 5.7 x 5.4 cm in greatest dimension.Ria Bush was selected.  The 7 x 10 cm matrix portion was cut to the appropriate size.  Local anesthetic was infiltrated around the border of the wound.  The posterior aspect of this was sutured in place with interrupted 4-0 Vicryl's.  Myriad morsels were placed underneath the matrix sheet on top of the granulating wound.  The distal aspect was then also secured with interrupted 4-0 Vicryl's.  The wound was then covered with the Sorbact.  This was secured with interrupted 3-0 Prolenes.  The wound was then cleaned, dried, and dressed with surgical lube, Telfa, gauze, and Ace wrap.  Patient was then placed into a postop shoe.  Patient was allowed to emerge from anesthesia and was taken the PACU in stable condition.  Needle, sponge, and instrument counts were correct per protocol.

## 2023-06-27 NOTE — Discharge Instructions (Addendum)
Dressing change as before. Surgilube, non stick gauze, gauze, wrap with coban or ace wrap.  Some walking OK, but not long walks.    Don't get wound wet until you see me.   Post Anesthesia Home Care Instructions  Activity: Get plenty of rest for the remainder of the day. A responsible individual must stay with you for 24 hours following the procedure.  For the next 24 hours, DO NOT: -Drive a car -Advertising copywriter -Drink alcoholic beverages -Take any medication unless instructed by your physician -Make any legal decisions or sign important papers.  Meals: Start with liquid foods such as gelatin or soup. Progress to regular foods as tolerated. Avoid greasy, spicy, heavy foods. If nausea and/or vomiting occur, drink only clear liquids until the nausea and/or vomiting subsides. Call your physician if vomiting continues.  Special Instructions/Symptoms: Your throat may feel dry or sore from the anesthesia or the breathing tube placed in your throat during surgery. If this causes discomfort, gargle with warm salt water. The discomfort should disappear within 24 hours.  If you had a scopolamine patch placed behind your ear for the management of post- operative nausea and/or vomiting:  1. The medication in the patch is effective for 72 hours, after which it should be removed.  Wrap patch in a tissue and discard in the trash. Wash hands thoroughly with soap and water. 2. You may remove the patch earlier than 72 hours if you experience unpleasant side effects which may include dry mouth, dizziness or visual disturbances. 3. Avoid touching the patch. Wash your hands with soap and water after contact with the patch.    May have tylenol at 6:20pm if needed.

## 2023-06-28 ENCOUNTER — Encounter (HOSPITAL_BASED_OUTPATIENT_CLINIC_OR_DEPARTMENT_OTHER): Payer: Self-pay | Admitting: General Surgery

## 2023-06-28 NOTE — Anesthesia Postprocedure Evaluation (Signed)
Anesthesia Post Note  Patient: Regina Moody  Procedure(s) Performed: PLACEMENT OF SKIN SUBSTITUTE LEFT FOOT WOUND (Left: Foot)     Patient location during evaluation: Other Anesthesia Type: MAC Level of consciousness: awake and alert Pain management: pain level controlled Vital Signs Assessment: post-procedure vital signs reviewed and stable Respiratory status: spontaneous breathing, nonlabored ventilation, respiratory function stable and patient connected to nasal cannula oxygen Cardiovascular status: stable and blood pressure returned to baseline Postop Assessment: no apparent nausea or vomiting Anesthetic complications: no  No notable events documented.  Last Vitals:  Vitals:   06/27/23 1523 06/27/23 1535  BP: 124/60 (!) 133/59  Pulse:  75  Resp: 14   Temp:  (!) 36.2 C  SpO2:  98%    Last Pain:  Vitals:   06/27/23 1535  TempSrc: Temporal  PainSc: 0-No pain                 Yoshito Gaza S

## 2023-07-10 ENCOUNTER — Encounter (HOSPITAL_BASED_OUTPATIENT_CLINIC_OR_DEPARTMENT_OTHER): Payer: HMO | Admitting: General Surgery

## 2023-07-15 DIAGNOSIS — D0372 Melanoma in situ of left lower limb, including hip: Secondary | ICD-10-CM | POA: Diagnosis not present

## 2023-07-15 DIAGNOSIS — C4372 Malignant melanoma of left lower limb, including hip: Secondary | ICD-10-CM | POA: Diagnosis not present

## 2023-08-05 DIAGNOSIS — C4372 Malignant melanoma of left lower limb, including hip: Secondary | ICD-10-CM | POA: Diagnosis not present

## 2023-08-05 DIAGNOSIS — D0372 Melanoma in situ of left lower limb, including hip: Secondary | ICD-10-CM | POA: Diagnosis not present

## 2023-08-20 DIAGNOSIS — Z48817 Encounter for surgical aftercare following surgery on the skin and subcutaneous tissue: Secondary | ICD-10-CM | POA: Diagnosis not present

## 2023-08-20 DIAGNOSIS — L814 Other melanin hyperpigmentation: Secondary | ICD-10-CM | POA: Diagnosis not present

## 2023-08-20 DIAGNOSIS — Z85820 Personal history of malignant melanoma of skin: Secondary | ICD-10-CM | POA: Diagnosis not present

## 2023-08-20 DIAGNOSIS — R233 Spontaneous ecchymoses: Secondary | ICD-10-CM | POA: Diagnosis not present

## 2023-08-20 DIAGNOSIS — L821 Other seborrheic keratosis: Secondary | ICD-10-CM | POA: Diagnosis not present

## 2023-08-20 DIAGNOSIS — L570 Actinic keratosis: Secondary | ICD-10-CM | POA: Diagnosis not present

## 2023-08-20 DIAGNOSIS — Z08 Encounter for follow-up examination after completed treatment for malignant neoplasm: Secondary | ICD-10-CM | POA: Diagnosis not present

## 2023-08-20 DIAGNOSIS — D225 Melanocytic nevi of trunk: Secondary | ICD-10-CM | POA: Diagnosis not present

## 2023-08-30 DIAGNOSIS — D0372 Melanoma in situ of left lower limb, including hip: Secondary | ICD-10-CM | POA: Diagnosis not present

## 2023-08-30 DIAGNOSIS — C4372 Malignant melanoma of left lower limb, including hip: Secondary | ICD-10-CM | POA: Diagnosis not present

## 2023-10-16 DIAGNOSIS — N3020 Other chronic cystitis without hematuria: Secondary | ICD-10-CM | POA: Diagnosis not present

## 2023-11-13 ENCOUNTER — Other Ambulatory Visit (HOSPITAL_COMMUNITY): Payer: Self-pay | Admitting: General Surgery

## 2023-11-13 DIAGNOSIS — G8929 Other chronic pain: Secondary | ICD-10-CM | POA: Diagnosis not present

## 2023-11-13 DIAGNOSIS — C4372 Malignant melanoma of left lower limb, including hip: Secondary | ICD-10-CM

## 2023-11-13 DIAGNOSIS — T148XXA Other injury of unspecified body region, initial encounter: Secondary | ICD-10-CM | POA: Insufficient documentation

## 2023-11-13 DIAGNOSIS — D0372 Melanoma in situ of left lower limb, including hip: Secondary | ICD-10-CM | POA: Diagnosis not present

## 2023-11-13 DIAGNOSIS — M79672 Pain in left foot: Secondary | ICD-10-CM | POA: Diagnosis not present

## 2023-11-15 ENCOUNTER — Ambulatory Visit (HOSPITAL_COMMUNITY)
Admission: RE | Admit: 2023-11-15 | Discharge: 2023-11-15 | Disposition: A | Discharge status: Home / Self Care | Source: Ambulatory Visit | Attending: General Surgery | Admitting: General Surgery

## 2023-11-15 DIAGNOSIS — C4372 Malignant melanoma of left lower limb, including hip: Secondary | ICD-10-CM | POA: Diagnosis not present

## 2023-11-15 DIAGNOSIS — M7989 Other specified soft tissue disorders: Secondary | ICD-10-CM | POA: Diagnosis not present

## 2023-11-15 MED ORDER — GADOBUTROL 1 MMOL/ML IV SOLN
4.5000 mL | Freq: Once | INTRAVENOUS | Status: AC | PRN
Start: 2023-11-15 — End: 2023-11-15
  Administered 2023-11-15: 4.5 mL via INTRAVENOUS

## 2023-12-09 ENCOUNTER — Other Ambulatory Visit: Payer: Self-pay | Admitting: General Surgery

## 2023-12-09 DIAGNOSIS — M86272 Subacute osteomyelitis, left ankle and foot: Secondary | ICD-10-CM | POA: Insufficient documentation

## 2023-12-09 DIAGNOSIS — L820 Inflamed seborrheic keratosis: Secondary | ICD-10-CM | POA: Diagnosis not present

## 2023-12-09 DIAGNOSIS — L821 Other seborrheic keratosis: Secondary | ICD-10-CM | POA: Diagnosis not present

## 2023-12-09 DIAGNOSIS — D0372 Melanoma in situ of left lower limb, including hip: Secondary | ICD-10-CM | POA: Diagnosis not present

## 2023-12-09 DIAGNOSIS — C4372 Malignant melanoma of left lower limb, including hip: Secondary | ICD-10-CM | POA: Diagnosis not present

## 2023-12-09 DIAGNOSIS — L819 Disorder of pigmentation, unspecified: Secondary | ICD-10-CM | POA: Insufficient documentation

## 2023-12-11 LAB — DERMATOLOGY PATHOLOGY

## 2023-12-12 ENCOUNTER — Encounter: Payer: Self-pay | Admitting: Internal Medicine

## 2023-12-12 ENCOUNTER — Other Ambulatory Visit: Payer: Self-pay

## 2023-12-12 ENCOUNTER — Ambulatory Visit: Admitting: Internal Medicine

## 2023-12-12 VITALS — BP 126/71 | HR 82 | Temp 98.3°F | Ht 59.0 in | Wt 97.0 lb

## 2023-12-12 DIAGNOSIS — Z85820 Personal history of malignant melanoma of skin: Secondary | ICD-10-CM

## 2023-12-12 DIAGNOSIS — M869 Osteomyelitis, unspecified: Secondary | ICD-10-CM

## 2023-12-12 MED ORDER — AMOXICILLIN-POT CLAVULANATE 875-125 MG PO TABS: 1.0000 | ORAL_TABLET | Freq: Two times a day (BID) | ORAL | 0 refills | Status: DC

## 2023-12-12 MED ORDER — DOXYCYCLINE HYCLATE 100 MG PO TABS: 100.0000 mg | ORAL_TABLET | Freq: Two times a day (BID) | ORAL | 0 refills | Status: DC

## 2023-12-12 NOTE — Progress Notes (Signed)
 There are no active problems to display for this patient.   Patient's Medications  New Prescriptions   No medications on file  Previous Medications   ASPIRIN EC 81 MG TABLET    Take 81 mg by mouth daily. Swallow whole.   CHOLECALCIFEROL (VITAMIN D3 MAXIMUM STRENGTH) 125 MCG (5000 UT) CAPSULE    Take 5,000 Units by mouth daily.   IBUPROFEN (ADVIL) 200 MG TABLET    Take 200 mg by mouth every 6 (six) hours as needed for mild pain.   OXYCODONE (OXY IR/ROXICODONE) 5 MG IMMEDIATE RELEASE TABLET    Take 1 tablet (5 mg total) by mouth every 6 (six) hours as needed for severe pain (pain score 7-10).  Modified Medications   No medications on file  Discontinued Medications   No medications on file    Subjective:  Today 12/12/23 : Discussed the use of AI scribe software for clinical note transcription with the patient, who gave verbal consent to proceed.    Regina Moody "Regina Moody" is an 83 year old female with a history of malignant melanoma of the left foot who presents with concern for left foot osteomyelitis.  She has ongoing issues with her left foot, including a sensation of coldness on the dorsum despite feeling warm, and pain on the left lateral foot. An MRI of the left foot showed concern for fifth toe osteomyelitis. She was prescribed Bactrim, which she took for a total of 20 days, with a gap of about a month between courses. The redness and pain have improved since starting the antibiotics.  Her history of malignant melanoma of the left foot was initially diagnosed in April 2024. The melanoma was located on the left plantar aspect and measured 2.4 mm with ulceration and a mitotic index of zero. She underwent a wide local excision with skin substitution coverage and sentinel node biopsy on Jan 15, 2023, which showed positive margins for melanoma in situ. A subsequent surgery included left fifth toe amputation, with margins still positive for melanoma in situ but less  extensive.  She has also experienced new pigmented lesions and a rash on her foot, for which a punch biopsy confirmed melanoma in situ. She underwent Mohs surgery on June 10, 2023, followed by skin substitute coverage on June 27, 2023. The pain has been debilitating, affecting her mood and quality of life.  No fevers or chills. Redness around the edges of the affected area and shooting pain down the side of her foot. She has been using collagen and an antibiotic ointment on the area.     Review of Systems: Review of Systems  All other systems reviewed and are negative.   Past Medical History:  Diagnosis Date   Frequent headaches    Osteoporosis    UTI (urinary tract infection)     Social History   Tobacco Use   Smoking status: Never   Smokeless tobacco: Never  Vaping Use   Vaping status: Never Used  Substance Use Topics   Alcohol use: No   Drug use: Never    No family history on file.  No Known Allergies  Health Maintenance  Topic Date Due   COVID-19 Vaccine (1) Never done   DTaP/Tdap/Td (1 - Tdap) Never done   Pneumonia Vaccine 72+ Years old (1 of 1 - PCV) Never done   INFLUENZA VACCINE  04/03/2024   Medicare Annual Wellness (AWV)  04/15/2024   DEXA SCAN  Completed  Zoster Vaccines- Shingrix  Completed   HPV VACCINES  Aged Out   Meningococcal B Vaccine  Aged Out    Objective:  Vitals:   12/12/23 1339  BP: 126/71  Pulse: 82  Temp: 98.3 F (36.8 C)  TempSrc: Oral  SpO2: 96%  Weight: 97 lb (44 kg)  Height: 4\' 11"  (1.499 m)   Body mass index is 19.59 kg/m.  Physical Exam Constitutional:      Appearance: Normal appearance.  HENT:     Head: Normocephalic and atraumatic.     Right Ear: Tympanic membrane normal.     Left Ear: Tympanic membrane normal.     Nose: Nose normal.     Mouth/Throat:     Mouth: Mucous membranes are moist.  Eyes:     Extraocular Movements: Extraocular movements intact.     Conjunctiva/sclera: Conjunctivae normal.      Pupils: Pupils are equal, round, and reactive to light.  Cardiovascular:     Rate and Rhythm: Normal rate and regular rhythm.     Heart sounds: No murmur heard.    No friction rub. No gallop.  Pulmonary:     Effort: Pulmonary effort is normal.     Breath sounds: Normal breath sounds.  Abdominal:     General: Abdomen is flat.     Palpations: Abdomen is soft.  Musculoskeletal:        General: Normal range of motion.  Skin:    General: Skin is warm and dry.  Neurological:     General: No focal deficit present.     Mental Status: She is alert and oriented to person, place, and time.  Psychiatric:        Mood and Affect: Mood normal.    Lab Results Lab Results  Component Value Date   WBC 8.5 01/25/2023   HGB 11.9 (L) 02/21/2023   HCT 35.0 (L) 02/21/2023   MCV 95.4 01/25/2023   PLT 134 (L) 01/25/2023    Lab Results  Component Value Date   CREATININE 0.80 02/21/2023   BUN 37 (H) 02/21/2023   NA 139 02/21/2023   K 4.1 02/21/2023   CL 108 02/21/2023   CO2 17 (L) 01/25/2023    Lab Results  Component Value Date   ALT 20 02/16/2022   AST 25 02/16/2022   ALKPHOS 76 02/16/2022   BILITOT 0.7 02/16/2022    No results found for: "CHOL", "HDL", "LDLCALC", "LDLDIRECT", "TRIG", "CHOLHDL" Lab Results  Component Value Date   LABRPR Non Reactive 08/11/2019   No results found for: "HIV1RNAQUANT", "HIV1RNAVL", "CD4TABS"   Problem List Items Addressed This Visit   None  Results   Assessment/Plan #Left fifth metatarsal osteo vs melanoma #Hx of malignant malenoma of left 5th toe -She underwent a wide local excision with skin substitution coverage and sentinel node biopsy on Jan 15, 2023, which showed positive margins for melanoma in situ.  -surgery included left fifth toe amputation, with margins still positive for melanoma in situ but less extensive. -MRI left foot on 11/27/23 showed prior amp of 5th phalanx,. cortical irregularity of the 5th metatrsal head and marrow edem in 5th  metarrsal head and neck insert , soft tissue edem soncerning for OM. Medial hallus sesamoid concerning for mild sesamoiditis vs osteonecrosis.  -referred to ID for possible osteo. I think its unlear if infection vs malignancy. Recc biopsy off of abx. Plan: I called dermatology , Dr. Jenni Mody and appt coming up on 4/21. Would like to see if pt can get  biopsy with derm(she has in the past) -No fevers or chills, no signs of local infeciton -Labs today -I rx's doxy and augmentin to be started after biopsy. Can be stopp if path and cx negative -f/u in one week for my chart visit.   Orlie Bjornstad, MD [facility] 12/12/2023, 2:03 PM   I have personally spent 85 minutes involved in face-to-face and non-face-to-face activities for this patient on the day of the visit. Professional time spent includes the following activities: Preparing to see the patient (review of tests), Obtaining and/or reviewing separately obtained history (admission/discharge record), Performing a medically appropriate examination and/or evaluation , Ordering medications/tests/procedures, referring and communicating with other health care professionals, Documenting clinical information in the EMR, Independently interpreting results (not separately reported), Communicating results to the patient/family/caregiver, Counseling and educating the patient/family/caregiver and Care coordination (not separately reported).

## 2023-12-13 LAB — COMPLETE METABOLIC PANEL WITHOUT GFR
AG Ratio: 1.6 (calc) (ref 1.0–2.5)
ALT: 19 U/L (ref 6–29)
AST: 24 U/L (ref 10–35)
Albumin: 4.2 g/dL (ref 3.6–5.1)
Alkaline phosphatase (APISO): 74 U/L (ref 37–153)
BUN: 18 mg/dL (ref 7–25)
CO2: 27 mmol/L (ref 20–32)
Calcium: 10 mg/dL (ref 8.6–10.4)
Chloride: 106 mmol/L (ref 98–110)
Creat: 0.79 mg/dL (ref 0.60–0.95)
Globulin: 2.7 g/dL (ref 1.9–3.7)
Glucose, Bld: 78 mg/dL (ref 65–99)
Potassium: 4.3 mmol/L (ref 3.5–5.3)
Sodium: 140 mmol/L (ref 135–146)
Total Bilirubin: 0.4 mg/dL (ref 0.2–1.2)
Total Protein: 6.9 g/dL (ref 6.1–8.1)

## 2023-12-13 LAB — CBC WITH DIFFERENTIAL/PLATELET
Absolute Lymphocytes: 1399 {cells}/uL (ref 850–3900)
Absolute Monocytes: 419 {cells}/uL (ref 200–950)
Basophils Absolute: 32 {cells}/uL (ref 0–200)
Basophils Relative: 0.6 %
Eosinophils Absolute: 90 {cells}/uL (ref 15–500)
Eosinophils Relative: 1.7 %
HCT: 38 % (ref 35.0–45.0)
Hemoglobin: 12.3 g/dL (ref 11.7–15.5)
MCH: 30.3 pg (ref 27.0–33.0)
MCHC: 32.4 g/dL (ref 32.0–36.0)
MCV: 93.6 fL (ref 80.0–100.0)
MPV: 12.5 fL (ref 7.5–12.5)
Monocytes Relative: 7.9 %
Neutro Abs: 3360 {cells}/uL (ref 1500–7800)
Neutrophils Relative %: 63.4 %
Platelets: 251 10*3/uL (ref 140–400)
RBC: 4.06 10*6/uL (ref 3.80–5.10)
RDW: 13.1 % (ref 11.0–15.0)
Total Lymphocyte: 26.4 %
WBC: 5.3 10*3/uL (ref 3.8–10.8)

## 2023-12-13 LAB — C-REACTIVE PROTEIN: CRP: <3 mg/L (ref <8.0)

## 2023-12-13 LAB — SEDIMENTATION RATE: Sed Rate: 28 mm/h (ref 0–30)

## 2023-12-19 ENCOUNTER — Telehealth: Payer: Self-pay | Admitting: Internal Medicine

## 2023-12-19 DIAGNOSIS — M869 Osteomyelitis, unspecified: Secondary | ICD-10-CM | POA: Diagnosis not present

## 2023-12-19 NOTE — Progress Notes (Signed)
 Virtual Visit via Telephone/Video Note   I connected with Regina Moody   On 12/19/2023 at 10:33 AM  by Video and verified that I am speaking with the correct person using two identifiers.   I discussed the limitations, risks, security and privacy concerns of performing an evaluation and management service by telephone and the availability of in person appointments. I also discussed with the patient that there may be a patient responsible charge related to this service. The patient expressed understanding and agreed to proceed.   Location:   Patient: Home Provider: [facility]        Subjective:   12/12/23 : Discussed the use of AI scribe software for clinical note transcription with the patient, who gave verbal consent to proceed.      Regina Moody "Regina Moody" is an 83 year old female with a history of malignant melanoma of the left foot who presents with concern for left foot osteomyelitis.   She has ongoing issues with her left foot, including a sensation of coldness on the dorsum despite feeling warm, and pain on the left lateral foot. An MRI of the left foot showed concern for fifth toe osteomyelitis. She was prescribed Bactrim, which she took for a total of 20 days, with a gap of about a month between courses. The redness and pain have improved since starting the antibiotics.   Her history of malignant melanoma of the left foot was initially diagnosed in April 2024. The melanoma was located on the left plantar aspect and measured 2.4 mm with ulceration and a mitotic index of zero. She underwent a wide local excision with skin substitution coverage and sentinel node biopsy on Jan 15, 2023, which showed positive margins for melanoma in situ. A subsequent surgery included left fifth toe amputation, with margins still positive for melanoma in situ but less extensive.   She has also experienced new pigmented lesions and a rash on her foot, for which a punch biopsy confirmed melanoma in  situ. She underwent Mohs surgery on June 10, 2023, followed by skin substitute coverage on June 27, 2023. The pain has been debilitating, affecting her mood and quality of life.   No fevers or chills. Redness around the edges of the affected area and shooting pain down the side of her foot. She has been using collagen and an antibiotic ointment on the area.    Today 12/19/23: Doing well no new complaints. Has not started abx  Review of Systems  All other systems reviewed and are negative.   Past Medical History:  Diagnosis Date   Frequent headaches    Osteoporosis    UTI (urinary tract infection)     Outpatient Medications Prior to Visit  Medication Sig Dispense Refill   amoxicillin -clavulanate (AUGMENTIN ) 875-125 MG tablet Take 1 tablet by mouth 2 (two) times daily. 84 tablet 0   aspirin EC 81 MG tablet Take 81 mg by mouth daily. Swallow whole.     Cholecalciferol (VITAMIN D3 MAXIMUM STRENGTH) 125 MCG (5000 UT) capsule Take 5,000 Units by mouth daily.     doxycycline  (VIBRA -TABS) 100 MG tablet Take 1 tablet (100 mg total) by mouth 2 (two) times daily. 84 tablet 0   ibuprofen (ADVIL) 200 MG tablet Take 200 mg by mouth every 6 (six) hours as needed for mild pain.     oxyCODONE  (OXY IR/ROXICODONE ) 5 MG immediate release tablet Take 1 tablet (5 mg total) by mouth every 6 (six) hours as needed for severe pain (pain  score 7-10). 30 tablet 0   No facility-administered medications prior to visit.     No Known Allergies  Social History   Tobacco Use   Smoking status: Never   Smokeless tobacco: Never  Vaping Use   Vaping status: Never Used  Substance Use Topics   Alcohol use: No   Drug use: Never    No family history on file.    Lab Results: Lab Results  Component Value Date   WBC 5.3 12/12/2023   HGB 12.3 12/12/2023   HCT 38.0 12/12/2023   MCV 93.6 12/12/2023   PLT 251 12/12/2023    Lab Results  Component Value Date   CREATININE 0.79 12/12/2023   BUN 18  12/12/2023   NA 140 12/12/2023   K 4.3 12/12/2023   CL 106 12/12/2023   CO2 27 12/12/2023    Lab Results  Component Value Date   ALT 19 12/12/2023   AST 24 12/12/2023   ALKPHOS 76 02/16/2022   BILITOT 0.4 12/12/2023     Assessment & Plan:  #Left fifth metatarsal osteo vs melanoma #Hx of malignant malenoma of left 5th toe -She underwent a wide local excision with skin substitution coverage and sentinel node biopsy on Jan 15, 2023, which showed positive margins for melanoma in situ.  -surgery included left fifth toe amputation, with margins still positive for melanoma in situ but less extensive. -MRI left foot on 11/27/23 showed prior amp of 5th phalanx,. cortical irregularity of the 5th metatrsal head and marrow edem in 5th metarrsal head and neck insert , soft tissue edem soncerning for OM. Medial hallus sesamoid concerning for mild sesamoiditis vs osteonecrosis.  -referred to ID for possible osteo. I think its unlear if infection vs malignancy. Recc biopsy off of abx. Plan: -Pt has not started abx. Following appt, I discussed ith Dr. Aden Honor the need for biopsy and Cx off of abx(sees derm on 4/21). Reached out to surgery for Bx and cx.  -Start abx following biopsy if possible  -F/U with ID on 4/29   Orlie Bjornstad, MD [facility] I have personally spent 42 minutes involved in face-to-face and non-face-to-face activities for this patient on the day of the visit.    12/22/23  9:02 AM

## 2023-12-23 ENCOUNTER — Other Ambulatory Visit: Payer: Self-pay | Admitting: General Surgery

## 2023-12-23 DIAGNOSIS — Z08 Encounter for follow-up examination after completed treatment for malignant neoplasm: Secondary | ICD-10-CM | POA: Diagnosis not present

## 2023-12-23 DIAGNOSIS — D225 Melanocytic nevi of trunk: Secondary | ICD-10-CM | POA: Diagnosis not present

## 2023-12-23 DIAGNOSIS — Z85820 Personal history of malignant melanoma of skin: Secondary | ICD-10-CM | POA: Diagnosis not present

## 2023-12-23 DIAGNOSIS — L821 Other seborrheic keratosis: Secondary | ICD-10-CM | POA: Diagnosis not present

## 2023-12-23 DIAGNOSIS — L570 Actinic keratosis: Secondary | ICD-10-CM | POA: Diagnosis not present

## 2023-12-23 DIAGNOSIS — L814 Other melanin hyperpigmentation: Secondary | ICD-10-CM | POA: Diagnosis not present

## 2023-12-26 ENCOUNTER — Telehealth: Payer: Self-pay

## 2023-12-26 NOTE — Telephone Encounter (Signed)
 Received call today from [facility], RN stating patient had concerns regarding left leg. Noticed today her left leg is dark pink, swollen from ankle up to calf, leg feels tight. Denies pain, fever, chills.  Per Dr. Zelda Hickman start oral antibiotics today. Advised she call office with update. Julien Odor, RMA

## 2023-12-30 ENCOUNTER — Other Ambulatory Visit: Payer: Self-pay | Admitting: General Surgery

## 2023-12-31 ENCOUNTER — Ambulatory Visit: Payer: Self-pay | Admitting: Internal Medicine

## 2023-12-31 ENCOUNTER — Other Ambulatory Visit: Payer: Self-pay

## 2023-12-31 VITALS — Wt 99.4 lb

## 2023-12-31 DIAGNOSIS — M869 Osteomyelitis, unspecified: Secondary | ICD-10-CM

## 2023-12-31 NOTE — Progress Notes (Signed)
 There are no active problems to display for this patient.   Patient's Medications  New Prescriptions   No medications on file  Previous Medications   AMOXICILLIN -CLAVULANATE (AUGMENTIN ) 875-125 MG TABLET    Take 1 tablet by mouth 2 (two) times daily.   ASPIRIN EC 81 MG TABLET    Take 81 mg by mouth daily. Swallow whole.   CHOLECALCIFEROL (VITAMIN D3 MAXIMUM STRENGTH) 125 MCG (5000 UT) CAPSULE    Take 5,000 Units by mouth daily.   DOXYCYCLINE  (VIBRA -TABS) 100 MG TABLET    Take 1 tablet (100 mg total) by mouth 2 (two) times daily.   IBUPROFEN (ADVIL) 200 MG TABLET    Take 200 mg by mouth every 6 (six) hours as needed for mild pain.   OXYCODONE  (OXY IR/ROXICODONE ) 5 MG IMMEDIATE RELEASE TABLET    Take 1 tablet (5 mg total) by mouth every 6 (six) hours as needed for severe pain (pain score 7-10).  Modified Medications   No medications on file  Discontinued Medications   No medications on file    Subjective: Regina Senior "[name]" is an 83 year old female with a history of malignant melanoma of the left foot who presents with concern for left foot osteomyelitis.   She has ongoing issues with her left foot, including a sensation of coldness on the dorsum despite feeling warm, and pain on the left lateral foot. An MRI of the left foot showed concern for fifth toe osteomyelitis. She was prescribed Bactrim, which she took for a total of 20 days, with a gap of about a month between courses. The redness and pain have improved since starting the antibiotics.   Her history of malignant melanoma of the left foot was initially diagnosed in April 2024. The melanoma was located on the left plantar aspect and measured 2.4 mm with ulceration and a mitotic index of zero. She underwent a wide local excision with skin substitution coverage and sentinel node biopsy on Jan 15, 2023, which showed positive margins for melanoma in situ. A subsequent surgery included left fifth toe amputation, with  margins still positive for melanoma in situ but less extensive.   She has also experienced new pigmented lesions and a rash on her foot, for which a punch biopsy confirmed melanoma in situ. She underwent Mohs surgery on June 10, 2023, followed by skin substitute coverage on June 27, 2023. The pain has been debilitating, affecting her mood and quality of life.   No fevers or chills. Redness around the edges of the affected area and shooting pain down the side of her foot. She has been using collagen and an antibiotic ointment on the area.      12/19/23: Doing well no new complaints. Has not started abx  Today 12/31/23: PT started abx due to redness and swelling.  Review of Systems: Review of Systems  All other systems reviewed and are negative.   Past Medical History:  Diagnosis Date   Frequent headaches    Osteoporosis    UTI (urinary tract infection)     Social History   Tobacco Use   Smoking status: Never   Smokeless tobacco: Never  Vaping Use   Vaping status: Never Used  Substance Use Topics   Alcohol use: No   Drug use: Never    No family history on file.  No Known Allergies  Health Maintenance  Topic Date Due   COVID-19 Vaccine (1) Never done  DTaP/Tdap/Td (1 - Tdap) Never done   Pneumonia Vaccine 39+ Years old (1 of 1 - PCV) Never done   INFLUENZA VACCINE  04/03/2024   Medicare Annual Wellness (AWV)  04/15/2024   DEXA SCAN  Completed   Zoster Vaccines- Shingrix  Completed   HPV VACCINES  Aged Out   Meningococcal B Vaccine  Aged Out    Objective:  Vitals:   12/31/23 1454  Weight: 99 lb 6.4 oz (45.1 kg)   Body mass index is 20.08 kg/m.  Physical Exam Constitutional:      Appearance: Normal appearance.  HENT:     Head: Normocephalic and atraumatic.     Right Ear: Tympanic membrane normal.     Left Ear: Tympanic membrane normal.     Nose: Nose normal.     Mouth/Throat:     Mouth: Mucous membranes are moist.  Eyes:     Extraocular Movements:  Extraocular movements intact.     Conjunctiva/sclera: Conjunctivae normal.     Pupils: Pupils are equal, round, and reactive to light.  Cardiovascular:     Rate and Rhythm: Normal rate and regular rhythm.     Heart sounds: No murmur heard.    No friction rub. No gallop.  Pulmonary:     Effort: Pulmonary effort is normal.     Breath sounds: Normal breath sounds.  Abdominal:     General: Abdomen is flat.     Palpations: Abdomen is soft.  Skin:    General: Skin is warm and dry.  Neurological:     General: No focal deficit present.     Mental Status: She is alert and oriented to person, place, and time.  Psychiatric:        Mood and Affect: Mood normal.    Lab Results Lab Results  Component Value Date   WBC 5.3 12/12/2023   HGB 12.3 12/12/2023   HCT 38.0 12/12/2023   MCV 93.6 12/12/2023   PLT 251 12/12/2023    Lab Results  Component Value Date   CREATININE 0.79 12/12/2023   BUN 18 12/12/2023   NA 140 12/12/2023   K 4.3 12/12/2023   CL 106 12/12/2023   CO2 27 12/12/2023    Lab Results  Component Value Date   ALT 19 12/12/2023   AST 24 12/12/2023   ALKPHOS 76 02/16/2022   BILITOT 0.4 12/12/2023    No results found for: "CHOL", "HDL", "LDLCALC", "LDLDIRECT", "TRIG", "CHOLHDL" Lab Results  Component Value Date   LABRPR Non Reactive 08/11/2019   No results found for: "HIV1RNAQUANT", "HIV1RNAVL", "CD4TABS"   Problem List Items Addressed This Visit   None  Results   Assessment/Plan #Left fifth metatarsal osteo vs melanoma #Hx of malignant malenoma of left 5th toe -She underwent a wide local excision with skin substitution coverage and sentinel node biopsy on Jan 15, 2023, which showed positive margins for melanoma in situ.  -surgery included left fifth toe amputation, with margins still positive for melanoma in situ but less extensive. -MRI left foot on 11/27/23 showed prior amp of 5th phalanx,. cortical irregularity of the 5th metatrsal head and marrow edem in  5th metarrsal head and neck insert , soft tissue edem soncerning for OM. Medial hallus sesamoid concerning for mild sesamoiditis vs osteonecrosis.  -referred to ID for possible osteo. I think its unlear if infection vs malignancy. Pt started doxy and augmetin on Friday 12/27/23 as she had swelling and erythema. Pt states swelling erythema improved since starting abx. -Pt saw Dr. Cherlynn Cornfield  yesterday(general surgery), Bx planned on 5/7 Plan -Continue doxy and augmentin . -Please obtain path and cx -If path c/w osteo then doxy + augmentin  x 6 weeks. ID f/u on 5/20  -f/u on 5/20 Orlie Bjornstad, MD [facility] 12/31/2023, 3:06 PM   I have personally spent 42 minutes involved in face-to-face and non-face-to-face activities for this patient on the day of the visit. Professional time spent includes the following activities: Preparing to see the patient (review of tests), Obtaining and/or reviewing separately obtained history (admission/discharge record), Performing a medically appropriate examination and/or evaluation , Ordering medications/tests/procedures, referring and communicating with other health care professionals, Documenting clinical information in the EMR, Independently interpreting results (not separately reported), Communicating results to the patient/family/caregiver, Counseling and educating the patient/family/caregiver and Care coordination (not separately reported).

## 2024-01-06 ENCOUNTER — Other Ambulatory Visit: Payer: Self-pay

## 2024-01-06 ENCOUNTER — Encounter (HOSPITAL_COMMUNITY): Payer: Self-pay | Admitting: General Surgery

## 2024-01-06 NOTE — H&P (Signed)
 PROVIDER:  Eppie Hasting, MD Patient Care Team: Jearldine Mina, MD as PCP - General (Internal Medicine) Katherin Pam, MD (Dermatology)   MRN: [id number] DOB: 1941/05/28 DATE OF ENCOUNTER: 12/30/2023 Initial History:     Patient presented with a new diagnosis of malignant melanoma of the left foot April 2024.  The patient started having some pain where she was seen to have a skin lesion.  She was referred to dermatology and a biopsy was performed.  This demonstrated a 2.5 mm melanoma from the left plantar aspect laterally.  There was ulceration but the mitotic index was 0.  Peripheral margins were positive deep margin was negative regression was absent microsatellitosis and LVI were absent perineural invasion was present.  The pathologic stage is at least a pT3b.   Of note, the patient is extremely healthy.  She gets around a lot as well as mows her own yard with a push mower and a riding mower depending on the portion of the yard.  She only takes a baby aspirin as her only medication.   She denies family history of cancer   Interval History:    Pt is s/p wide local excision with skin substitute coverage and sentinel node biopsy 01/15/2023.  Margins were positive for melanoma in situ nearly circumferentially.  There was residual invasive cancer, but  this was all removed.  1 sentinel node was negative.  Final path was pT3bN0   Pt went back to the OR 6/20 for reexcision including left 5th toe amputation.  Margin was still positive for MIS, but less extensive.  Pt was having a lot of pain this time after the amputation.  She also had a sensation of the dorsum of her foot being cold despite it feeling warm to touch.  I prescribed doxycycline .    Pt saw [facility] 8/9 for concerns of a new pigmented lesion on her foot as well as a rash.  [facility] did punch biopsy on new pigmented region and this was melanoma in situ.   After conversation with Dr. Martina Sledge, patient was referred to the skin surgery center  and Dr. Gideon Kussmaul did Mohs on her to eradicate the remainder of the melanoma in situ (06/10/23).  This left a relatively large wound. Margins were reportedly negative.     I took her back to the OR 06/27/23 for skin substitute coverage to facilitate more rapid healing.  She did well other than some soreness at the toe amputation site.     Patient continued to have a lot of pain in her left lateral foot as well as a new open area.  MR was performed and was concerning for osteomyelitis of the firth metatarsal head.  I placed her on bactrim and referred to ID.  ID felt like there was reasonable concern that this was not osteomyelitis but could reflect melanoma.  They requested bone biopsy off antibiotics.  She is referred back to discuss.  She continues to have significant discomfort in this area.   MR foot left 11/15/2023 IMPRESSION: 1. Prior amputation of fifth phalanx. Cortical irregularity of the fifth metatarsal head with marrow edema in the fifth metatarsal head and neck insert on the soft tissue edema concerning for osteomyelitis. 2. Mild marrow edema in the medial hallux sesamoid concerning for mild sesamoiditis versus osteonecrosis.   Path 04/12/2023 Skin , left foot lesion MALIGNANT MELANOMA IN SITU, PERIPHERAL MARGIN INVOLVED, SEE DESCRIPTION There is a proliferation of atypical melanocytes arranged as solitary units and in nests at the  dermal epidermal junction with scattered atypical melanocytes throughout the upper reaches of the epidermis. Where nests are present, they tend to confluence. The dermis contains an inflammatory cell infiltrate and melanophages, but no diagnostic invasive disease is seen. The findings are those of a melanoma in situ. Following the review of H&E sections, Melan-A and SOX-10 stains were performed to highlight the melanocytes in this lesion. There is positive diffuse immunoreactivity for PRAME, which is consistent with this diagnosis. The lesion extends to the  peripheral margin of the specimen.   Pathology 01/15/2023 A.   LEFT INGUINAL SENTINEL LYMPH NODE, EXCISION: 0 /1 SENTINEL LYMPH NODE, NEGATIVE FOR MELANOMA, SEE COMMENT COMMENT: One lymph node is examined with H and E, S100, Sox-10, HMB-45 and MelanA and is negative for melanoma. This is one sentinel lymph node, negative for melanoma.  B.   SKIN, LEFT FOOT, EXCISION: RESIDUAL INVASIVE AND IN-SITU MELANOMA, INVOLVING MULIFOCAL MARGINS WITH MELANOMA IN SITU: NEARLY CIRCUMFERENTIAL INVOLVEMENT BY MELANOMA IN SITU FROM  9:30 THROUGH 12 THROUGH 6 TO 8:30 MARGIN INVOLVEMENT WITH MELANOMA IN SITU. NO INVASIVE DISEASE AT MARGIN. RESIDUAL INVASIVE DISEASE MEASURING 1.5 MM IN GREATEST DEPTH (BLOCK B1), DEEP MARGIN FREE, TABLE UPDATED FROM [facility] [phone] PT3B N0 MX MELANOMA MELANOMA TABLE (ABBREVIATED FROM [facility]): PROCEDURE: EXCISION SPECIMEN ANATOMIC SITE: LEFT PLANTAR FOREFOO OVERLYING 5TH METATARSAL HISTOLOGIC TYPE: ACRAL LENTIGINOUS BRESLOW'S DEPTH: 2.5 MM (1.5 MM REMAINING ON EXCISIONAL SPECIMEN, NO UPSTAGE) CLARK/ANATOMIC LEVEL: IV MARGINS: PERIPHERAL: BLOCKS B1, B3, B3, B4 INVOLVE MELANOMA IN SITU AT GREEN INK CIRCUMFERENTIALLY AND ORANGE INK BLOCKS B1, B2, B3 DEEP: FREE ULCERATION: PRESENT SATELLITOSIS: ABSENT MITOTIC INDEX: 0/MM2 LYMPHOVASCULAR INVASION: ABSENT NEUTROTROPISM: ABSENT TUMOR-INFILTRATING LYMPHOCYTES: ABSENT IN EXCISION SPECIMEN, NOT REPORTED IN PRIMARY REGRESSION: ABSENT LYMPH NODES: 0/1 SLN, NEGATIVE FOR MELANOMA PATHOLOGIC STAGE: PT3B N0 MX    Physical Examination:    Left foot wound has opened a little bit more.  Underlying bone remains dark.  Overlying scar is slightly thinner.       Assessment and Plan:    Assessment Diagnoses and all orders for this visit:   Subacute osteomyelitis of left foot (CMS/HHS-HCC)   Melanoma of foot, left (CMS/HHS-HCC)   Chronic pain in left foot   Open wound     Will take patient to the  operating room for a bone biopsy.  Will also biopsy the overlying skin.  Will try to pull this together and a primary closure to avoid prolonged wound healing.  Patient desires to get back to her gardening.  This has been a year-long process.

## 2024-01-06 NOTE — Progress Notes (Signed)
 PCP - Jearldine Mina, MD  Cardiologist -   PPM/ICD - denies Device Orders - n/a Rep Notified - n/a  Chest x-ray - denies EKG - denies Stress Test - denies ECHO - denies Cardiac Cath - denies  CPAP - denies  Dm- denies  Blood Thinner Instructions: denies Aspirin Instructions: instructed patient to call surgeon for further instructions regarding medication  ERAS Protcol - clear liquids until 6:00   COVID TEST- n/a  Anesthesia review: no  Patient verbally denies any shortness of breath, fever, cough and chest pain during phone call   -------------  SDW INSTRUCTIONS given:  Your procedure is scheduled on Jan 08, 2024.  Report to [facility] Main Entrance "A" at 6:30 A.M., and check in at the Admitting office.  Call this number if you have problems the morning of surgery:  [phone]   Remember:  Do not eat after midnight the night before your surgery  You may drink clear liquids until 6:00 the morning of your surgery.   Clear liquids allowed are: Water, Non-Citrus Juices (without pulp), Carbonated Beverages, Clear Tea, Black Coffee Only, and Gatorade    Take these medicines the morning of surgery with A SIP OF WATER NONE   As of today, STOP taking any Aspirin (unless otherwise instructed by your surgeon) Aleve, Naproxen, Ibuprofen, Motrin, Advil, Goody's, BC's, all herbal medications, fish oil, and all vitamins.                      Do not wear jewelry, make up, or nail polish            Do not wear lotions, powders, perfumes/colognes, or deodorant.            Do not shave 48 hours prior to surgery.  Men may shave face and neck.            Do not bring valuables to the hospital.            [facility] is not responsible for any belongings or valuables.  Do NOT Smoke (Tobacco/Vaping) 24 hours prior to your procedure If you use a CPAP at night, you may bring all equipment for your overnight stay.   Contacts, glasses, dentures or bridgework may not be worn into  surgery.      For patients admitted to the hospital, discharge time will be determined by your treatment team.   Patients discharged the day of surgery will not be allowed to drive home, and someone needs to stay with them for 24 hours.    Special instructions:   [place]- Preparing For Surgery  Before surgery, you can play an important role. Because skin is not sterile, your skin needs to be as free of germs as possible. You can reduce the number of germs on your skin by washing with CHG (chlorahexidine gluconate) Soap before surgery.  CHG is an antiseptic cleaner which kills germs and bonds with the skin to continue killing germs even after washing.    Oral Hygiene is also important to reduce your risk of infection.  Remember - BRUSH YOUR TEETH THE MORNING OF SURGERY WITH YOUR REGULAR TOOTHPASTE  Please do not use if you have an allergy to CHG or antibacterial soaps. If your skin becomes reddened/irritated stop using the CHG.  Do not shave (including legs and underarms) for at least 48 hours prior to first CHG shower. It is OK to shave your face.  Please follow these instructions carefully.  Shower the NIGHT BEFORE SURGERY and the MORNING OF SURGERY with DIAL Soap.   Pat yourself dry with a CLEAN TOWEL.  Wear CLEAN PAJAMAS to bed the night before surgery  Place CLEAN SHEETS on your bed the night of your first shower and DO NOT SLEEP WITH PETS.   Day of Surgery: Please shower morning of surgery  Wear Clean/Comfortable clothing the morning of surgery Do not apply any deodorants/lotions.   Remember to brush your teeth WITH YOUR REGULAR TOOTHPASTE.   Questions were answered. Patient verbalized understanding of instructions.

## 2024-01-07 NOTE — Anesthesia Preprocedure Evaluation (Signed)
 Anesthesia Evaluation  Patient identified by MRN, date of birth, ID band Patient awake    Reviewed: Allergy & Precautions, H&P , NPO status , Patient's Chart, lab work & pertinent test results  Airway Mallampati: II  TM Distance: >3 FB Neck ROM: Full    Dental no notable dental hx.    Pulmonary neg pulmonary ROS   Pulmonary exam normal breath sounds clear to auscultation       Cardiovascular negative cardio ROS Normal cardiovascular exam Rhythm:Regular Rate:Normal     Neuro/Psych  Headaches negative neurological ROS  negative psych ROS   GI/Hepatic negative GI ROS, Neg liver ROS,,,  Endo/Other  negative endocrine ROS    Renal/GU negative Renal ROS  negative genitourinary   Musculoskeletal negative musculoskeletal ROS (+)  Osteoporosis    Abdominal   Peds negative pediatric ROS (+)  Hematology negative hematology ROS (+)   Anesthesia Other Findings H/o Melanoma   Reproductive/Obstetrics negative OB ROS                             Anesthesia Physical Anesthesia Plan  ASA: 3  Anesthesia Plan: General   Post-op Pain Management: Minimal or no pain anticipated   Induction: Intravenous  PONV Risk Score and Plan: 2 and Treatment may vary due to age or medical condition and TIVA  Airway Management Planned: LMA  Additional Equipment: None  Intra-op Plan:   Post-operative Plan:   Informed Consent: I have reviewed the patients History and Physical, chart, labs and discussed the procedure including the risks, benefits and alternatives for the proposed anesthesia with the patient or authorized representative who has indicated his/her understanding and acceptance.     Dental advisory given  Plan Discussed with: CRNA and Anesthesiologist  Anesthesia Plan Comments:         Anesthesia Quick Evaluation

## 2024-01-08 ENCOUNTER — Other Ambulatory Visit: Payer: Self-pay

## 2024-01-08 ENCOUNTER — Ambulatory Visit (HOSPITAL_COMMUNITY): Payer: Self-pay | Admitting: Anesthesiology

## 2024-01-08 ENCOUNTER — Encounter (HOSPITAL_COMMUNITY): Payer: Self-pay | Admitting: General Surgery

## 2024-01-08 ENCOUNTER — Encounter (HOSPITAL_COMMUNITY)
Admission: RE | Disposition: A | Discharge status: Home / Self Care | Payer: Self-pay | Source: Home / Self Care | Attending: General Surgery

## 2024-01-08 ENCOUNTER — Ambulatory Visit (HOSPITAL_BASED_OUTPATIENT_CLINIC_OR_DEPARTMENT_OTHER): Payer: Self-pay | Admitting: Anesthesiology

## 2024-01-08 ENCOUNTER — Ambulatory Visit (HOSPITAL_COMMUNITY)
Admission: RE | Admit: 2024-01-08 | Discharge: 2024-01-08 | Disposition: A | Discharge status: Home / Self Care | Attending: General Surgery | Admitting: General Surgery

## 2024-01-08 DIAGNOSIS — C4372 Malignant melanoma of left lower limb, including hip: Secondary | ICD-10-CM | POA: Diagnosis not present

## 2024-01-08 DIAGNOSIS — M810 Age-related osteoporosis without current pathological fracture: Secondary | ICD-10-CM | POA: Diagnosis not present

## 2024-01-08 DIAGNOSIS — X58XXXA Exposure to other specified factors, initial encounter: Secondary | ICD-10-CM | POA: Insufficient documentation

## 2024-01-08 DIAGNOSIS — L570 Actinic keratosis: Secondary | ICD-10-CM | POA: Insufficient documentation

## 2024-01-08 DIAGNOSIS — S91302A Unspecified open wound, left foot, initial encounter: Secondary | ICD-10-CM | POA: Insufficient documentation

## 2024-01-08 DIAGNOSIS — M86272 Subacute osteomyelitis, left ankle and foot: Secondary | ICD-10-CM | POA: Diagnosis not present

## 2024-01-08 DIAGNOSIS — M86672 Other chronic osteomyelitis, left ankle and foot: Secondary | ICD-10-CM | POA: Insufficient documentation

## 2024-01-08 DIAGNOSIS — L859 Epidermal thickening, unspecified: Secondary | ICD-10-CM | POA: Diagnosis not present

## 2024-01-08 DIAGNOSIS — Z85820 Personal history of malignant melanoma of skin: Secondary | ICD-10-CM | POA: Insufficient documentation

## 2024-01-08 HISTORY — PX: BONE BIOPSY: SHX375

## 2024-01-08 SURGERY — BIOPSY, BONE
Anesthesia: General | Site: Foot | Laterality: Left

## 2024-01-08 MED ORDER — PHENYLEPHRINE 80 MCG/ML (10ML) SYRINGE FOR IV PUSH (FOR BLOOD PRESSURE SUPPORT)
PREFILLED_SYRINGE | INTRAVENOUS | Status: AC
  Filled 2024-01-08: qty 10

## 2024-01-08 MED ORDER — ONDANSETRON HCL 4 MG/2ML IJ SOLN
INTRAMUSCULAR | Status: DC | PRN
  Administered 2024-01-08: 4 mg via INTRAVENOUS

## 2024-01-08 MED ORDER — FENTANYL CITRATE (PF) 100 MCG/2ML IJ SOLN
25.0000 ug | INTRAMUSCULAR | Status: DC | PRN
  Administered 2024-01-08: 25 ug via INTRAVENOUS

## 2024-01-08 MED ORDER — ONDANSETRON HCL 4 MG/2ML IJ SOLN
INTRAMUSCULAR | Status: AC
  Filled 2024-01-08: qty 2

## 2024-01-08 MED ORDER — LIDOCAINE 2% (20 MG/ML) 5 ML SYRINGE
INTRAMUSCULAR | Status: AC
  Filled 2024-01-08: qty 5

## 2024-01-08 MED ORDER — CHLORHEXIDINE GLUCONATE CLOTH 2 % EX PADS
6.0000 | MEDICATED_PAD | Freq: Once | CUTANEOUS | Status: DC
Start: 2024-01-08 — End: 2024-01-08

## 2024-01-08 MED ORDER — LIDOCAINE HCL 1 % IJ SOLN
INTRAMUSCULAR | Status: DC | PRN
  Administered 2024-01-08: 4 mL via INTRAMUSCULAR

## 2024-01-08 MED ORDER — PROPOFOL 10 MG/ML IV BOLUS
INTRAVENOUS | Status: AC
  Filled 2024-01-08: qty 20

## 2024-01-08 MED ORDER — OXYCODONE HCL 5 MG/5ML PO SOLN: 5.0000 mg | Freq: Once | ORAL | Status: DC | PRN

## 2024-01-08 MED ORDER — CHLORHEXIDINE GLUCONATE 0.12 % MT SOLN
OROMUCOSAL | Status: AC
  Administered 2024-01-08: 15 mL
  Filled 2024-01-08: qty 15

## 2024-01-08 MED ORDER — BUPIVACAINE-EPINEPHRINE (PF) 0.25% -1:200000 IJ SOLN
INTRAMUSCULAR | Status: AC
  Filled 2024-01-08: qty 30

## 2024-01-08 MED ORDER — PROPOFOL 500 MG/50ML IV EMUL
INTRAVENOUS | Status: DC | PRN
  Administered 2024-01-08: 100 ug/kg/min via INTRAVENOUS

## 2024-01-08 MED ORDER — 0.9 % SODIUM CHLORIDE (POUR BTL) OPTIME
TOPICAL | Status: DC | PRN
  Administered 2024-01-08: 1000 mL

## 2024-01-08 MED ORDER — CEFAZOLIN SODIUM-DEXTROSE 2-4 GM/100ML-% IV SOLN
2.0000 g | INTRAVENOUS | Status: AC
  Administered 2024-01-08: 2 g via INTRAVENOUS
  Filled 2024-01-08: qty 100

## 2024-01-08 MED ORDER — OXYCODONE HCL 5 MG PO TABS: 5.0000 mg | ORAL_TABLET | Freq: Four times a day (QID) | ORAL | 0 refills | Status: DC | PRN

## 2024-01-08 MED ORDER — FENTANYL CITRATE (PF) 250 MCG/5ML IJ SOLN
INTRAMUSCULAR | Status: AC
  Filled 2024-01-08: qty 5

## 2024-01-08 MED ORDER — PROPOFOL 10 MG/ML IV BOLUS
INTRAVENOUS | Status: DC | PRN
  Administered 2024-01-08: 50 mg via INTRAVENOUS
  Administered 2024-01-08: 80 mg via INTRAVENOUS

## 2024-01-08 MED ORDER — ONDANSETRON HCL 4 MG/2ML IJ SOLN: 4.0000 mg | Freq: Once | INTRAMUSCULAR | Status: DC | PRN

## 2024-01-08 MED ORDER — OXYCODONE HCL 5 MG PO TABS: 5.0000 mg | ORAL_TABLET | Freq: Once | ORAL | Status: DC | PRN

## 2024-01-08 MED ORDER — FENTANYL CITRATE (PF) 100 MCG/2ML IJ SOLN
INTRAMUSCULAR | Status: AC
  Filled 2024-01-08: qty 2

## 2024-01-08 MED ORDER — DEXAMETHASONE SODIUM PHOSPHATE 10 MG/ML IJ SOLN
INTRAMUSCULAR | Status: AC
  Filled 2024-01-08: qty 1

## 2024-01-08 MED ORDER — LACTATED RINGERS IV SOLN: INTRAVENOUS | Status: DC | PRN

## 2024-01-08 MED ORDER — LIDOCAINE 2% (20 MG/ML) 5 ML SYRINGE
INTRAMUSCULAR | Status: DC | PRN
  Administered 2024-01-08: 100 mg via INTRAVENOUS

## 2024-01-08 MED ORDER — FENTANYL CITRATE (PF) 250 MCG/5ML IJ SOLN
INTRAMUSCULAR | Status: DC | PRN
  Administered 2024-01-08: 25 ug via INTRAVENOUS

## 2024-01-08 MED ORDER — MEPERIDINE HCL 25 MG/ML IJ SOLN: 6.2500 mg | INTRAMUSCULAR | Status: DC | PRN

## 2024-01-08 MED ORDER — DEXAMETHASONE SODIUM PHOSPHATE 10 MG/ML IJ SOLN
INTRAMUSCULAR | Status: DC | PRN
  Administered 2024-01-08: 5 mg via INTRAVENOUS

## 2024-01-08 MED ORDER — LIDOCAINE HCL 1 % IJ SOLN
INTRAMUSCULAR | Status: AC
  Filled 2024-01-08: qty 20

## 2024-01-08 MED ORDER — ACETAMINOPHEN 500 MG PO TABS
1000.0000 mg | ORAL_TABLET | ORAL | Status: AC
  Administered 2024-01-08: 1000 mg via ORAL
  Filled 2024-01-08: qty 2

## 2024-01-08 MED ORDER — CHLORHEXIDINE GLUCONATE CLOTH 2 % EX PADS: 6.0000 | MEDICATED_PAD | Freq: Once | CUTANEOUS | Status: DC

## 2024-01-08 SURGICAL SUPPLY — 24 items
BNDG COHESIVE 4X5 TAN STRL LF (GAUZE/BANDAGES/DRESSINGS) IMPLANT
CANISTER SUCT 3000ML PPV (MISCELLANEOUS) ×1 IMPLANT
CHLORAPREP W/TINT 26 (MISCELLANEOUS) ×1 IMPLANT
COVER SURGICAL LIGHT HANDLE (MISCELLANEOUS) ×1 IMPLANT
DRAPE EXTREMITY T 121X128X90 (DISPOSABLE) IMPLANT
ELECTRODE REM PT RTRN 9FT ADLT (ELECTROSURGICAL) ×1 IMPLANT
GAUZE 4X4 16PLY ~~LOC~~+RFID DBL (SPONGE) ×1 IMPLANT
GAUZE SPONGE 4X4 12PLY STRL (GAUZE/BANDAGES/DRESSINGS) IMPLANT
GAUZE XEROFORM 1X8 LF (GAUZE/BANDAGES/DRESSINGS) IMPLANT
GLOVE BIO SURGEON STRL SZ 6 (GLOVE) ×1 IMPLANT
GLOVE INDICATOR 6.5 STRL GRN (GLOVE) ×1 IMPLANT
GOWN STRL REUS W/ TWL XL LVL3 (GOWN DISPOSABLE) ×1 IMPLANT
KIT BASIN OR (CUSTOM PROCEDURE TRAY) ×1 IMPLANT
KIT TURNOVER KIT B (KITS) ×1 IMPLANT
MARKER SKIN DUAL TIP RULER LAB (MISCELLANEOUS) ×1 IMPLANT
NDL HYPO 25GX1X1/2 BEV (NEEDLE) ×2 IMPLANT
NEEDLE HYPO 25GX1X1/2 BEV (NEEDLE) ×1 IMPLANT
NS IRRIG 1000ML POUR BTL (IV SOLUTION) ×1 IMPLANT
PACK GENERAL/GYN (CUSTOM PROCEDURE TRAY) ×1 IMPLANT
PAD ARMBOARD POSITIONER FOAM (MISCELLANEOUS) ×2 IMPLANT
SPECIMEN JAR SMALL (MISCELLANEOUS) ×1 IMPLANT
SUT ETHILON 2 0 FS 18 (SUTURE) ×1 IMPLANT
SYR CONTROL 10ML LL (SYRINGE) ×2 IMPLANT
TOWEL GREEN STERILE FF (TOWEL DISPOSABLE) ×1 IMPLANT

## 2024-01-08 NOTE — Op Note (Signed)
 PRE-OPERATIVE DIAGNOSIS: melanoma left foot, question osteomyelitis left 5th metatarsal  POST-OPERATIVE DIAGNOSIS:  Same  PROCEDURE:  Procedure(s): Biopsy left 5th metatarsal head  SURGEON:  Surgeon(s): Lockie Rima, MD  ANESTHESIA:   local and general  DRAINS: none   LOCAL MEDICATIONS USED:  BUPIVICAINE  and LIDOCAINE    SPECIMEN:  Source of Specimen:  skin biopsy, biopsy left 5th metatarsal head for path and for culture  DISPOSITION OF SPECIMEN:   path and micro  COUNTS:  YES  DICTATION: .Dragon Dictation  PLAN OF CARE: Discharge to home after PACU  PATIENT DISPOSITION:  PACU - hemodynamically stable.  FINDINGS:  good quality bone, firm skin  EBL: min  PROCEDURE:  Patient was identified in the holding area and taken the operating room where she was placed supine on the operating room table.  General anesthesia was induced.  The left foot was prepped and draped in sterile fashion.  A timeout was performed according to the surgical safety checklist.  When all was correct, we continued.  The previous site of melanoma was identified with the open wound.  A small portion of skin was taken at the site of the open wound.  This was sent for pathology.  The underlying bone was taken with ranchers.  Some of this was sent for biopsy to pathology and some of this was sent for culture.  Cultures were sent for aerobic and anaerobic as well as acid-fast and fungal cultures.    Given that the distal portion of the bone was now removed, the skin was able to be pulled together.  Two 2-0 nylon mattress sutures were used to close the skin.  This was then cleaned, dried, and dressed with Xeroform, gauze, and Coban.    The patient then emerged from anesthesia and was taken to the PACU in stable condition.  Needle, sponge, and instrument counts were correct x 2.

## 2024-01-08 NOTE — Anesthesia Procedure Notes (Signed)
 Procedure Name: LMA Insertion Date/Time: 01/08/2024 9:04 AM  Performed by: Elysha Daw J, CRNAPre-anesthesia Checklist: Patient identified, Emergency Drugs available, Suction available and Patient being monitored Patient Re-evaluated:Patient Re-evaluated prior to induction Oxygen Delivery Method: Circle System Utilized Preoxygenation: Pre-oxygenation with 100% oxygen Induction Type: IV induction Ventilation: Mask ventilation without difficulty LMA: LMA inserted LMA Size: 4.0 Number of attempts: 1 Airway Equipment and Method: Bite block Placement Confirmation: positive ETCO2 Tube secured with: Tape Dental Injury: Teeth and Oropharynx as per pre-operative assessment

## 2024-01-08 NOTE — Interval H&P Note (Signed)
 History and Physical Interval Note:  01/08/2024 8:37 AM  Regina Moody  has presented today for surgery, with the diagnosis of SUBACUTE OSTEOMYELITIS OF LEFT FOOT.  The various methods of treatment have been discussed with the patient and family. After consideration of risks, benefits and other options for treatment, the patient has consented to  Procedure(s) with comments: BIOPSY, BONE (Left) - LEFT 5TH METATARSAL BIOPSY as a surgical intervention.  The patient's history has been reviewed, patient examined, no change in status, stable for surgery.  I have reviewed the patient's chart and labs.  Questions were answered to the patient's satisfaction.     Lockie Rima

## 2024-01-08 NOTE — Transfer of Care (Signed)
 Immediate Anesthesia Transfer of Care Note  Patient: Regina Moody  Procedure(s) Performed: BIOPSY, BONE (Left)  Patient Location: PACU  Anesthesia Type:General  Level of Consciousness: awake, alert , and oriented  Airway & Oxygen Therapy: Patient Spontanous Breathing and Patient connected to face mask oxygen  Post-op Assessment: Report given to RN and Post -op Vital signs reviewed and stable  Post vital signs: Reviewed and stable  Last Vitals:  Vitals Value Taken Time  BP 126/56 01/08/24 1001  Temp    Pulse 64 01/08/24 1005  Resp 15 01/08/24 1005  SpO2 93 % 01/08/24 1005  Vitals shown include unfiled device data.  Last Pain:  Vitals:   01/08/24 1000  TempSrc:   PainSc: 0-No pain         Complications: No notable events documented.

## 2024-01-08 NOTE — Anesthesia Postprocedure Evaluation (Signed)
 Anesthesia Post Note  Patient: Regina Moody  Procedure(s) Performed: BIOPSY, BONE (Left)     Patient location during evaluation: PACU Anesthesia Type: General Level of consciousness: awake and alert Pain management: pain level controlled Vital Signs Assessment: post-procedure vital signs reviewed and stable Respiratory status: spontaneous breathing, nonlabored ventilation, respiratory function stable and patient connected to nasal cannula oxygen Cardiovascular status: blood pressure returned to baseline and stable Postop Assessment: no apparent nausea or vomiting Anesthetic complications: no   No notable events documented.  Last Vitals:  Vitals:   01/08/24 1015 01/08/24 1030  BP: (!) 136/57 (!) 124/47  Pulse: 63 (!) 54  Resp: 14 11  Temp:  (!) 36.2 C  SpO2: 99% 100%    Last Pain:  Vitals:   01/08/24 1019  TempSrc:   PainSc: 7                  Daemian Gahm

## 2024-01-08 NOTE — Progress Notes (Signed)
 75 mcg of fentanyl  wasted in steri cycle witnessed by Lancie Clark-Currie RN.  Unable to waste in pyxis system because patient was already discharged from that system.

## 2024-01-08 NOTE — Discharge Instructions (Addendum)
 [facility] Office Phone Number [phone]   POST OP INSTRUCTIONS  Always review your discharge instruction sheet given to you by the facility where your surgery was performed.  IF YOU HAVE DISABILITY OR FAMILY LEAVE FORMS, YOU MUST BRING THEM TO THE OFFICE FOR PROCESSING.  DO NOT GIVE THEM TO YOUR DOCTOR.  Take 2 tylenol (acetominophen) three times a day for 3 days.  If you still have pain, add ibuprofen with food in between if able to take this (if you have kidney issues or stomach issues, do not take ibuprofen).  If both of those are not enough, add the narcotic pain pill.  If you find you are needing a lot of this overnight after surgery, call the next morning for a refill.   Take your usually prescribed medications unless otherwise directed If you need a refill on your pain medication, please contact your pharmacy.  They will contact our office to request authorization.  Prescriptions will not be filled after 5pm or on week-ends. You should eat very light the first 24 hours after surgery, such as soup, crackers, pudding, etc.  Resume your normal diet the day after surgery It is common to experience some constipation if taking pain medication after surgery.  Increasing fluid intake and taking a stool softener will usually help or prevent this problem from occurring.  A mild laxative (Milk of Magnesia or Miralax) should be taken according to package directions if there are no bowel movements after 48 hours. You may shower in 48 hours.  The surgical glue will flake off in 2-3 weeks.   ACTIVITIES:  No strenuous activity or heavy lifting for 1 week.   You may drive when you no longer are taking prescription pain medication, you can comfortably wear a seatbelt, and you can safely maneuver your car and apply brakes. RETURN TO WORK:  __________n/a_______________ Bonita Quin should see your doctor in the office for a follow-up appointment approximately three-four weeks after your surgery.     WHEN TO CALL YOUR DOCTOR: Fever over 101.0 Nausea and/or vomiting. Extreme swelling or bruising. Continued bleeding from incision. Increased pain, redness, or drainage from the incision.  The clinic staff is available to answer your questions during regular business hours.  Please don't hesitate to call and ask to speak to one of the nurses for clinical concerns.  If you have a medical emergency, go to the nearest emergency room or call 911.  A surgeon from [facility] is always on call at the hospital.  For further questions, please visit [email or web address]

## 2024-01-09 ENCOUNTER — Encounter (HOSPITAL_COMMUNITY): Payer: Self-pay | Admitting: General Surgery

## 2024-01-09 LAB — SURGICAL PATHOLOGY

## 2024-01-10 LAB — ACID FAST SMEAR (AFB, MYCOBACTERIA): Acid Fast Smear: NEGATIVE

## 2024-01-13 LAB — AEROBIC/ANAEROBIC CULTURE W GRAM STAIN (SURGICAL/DEEP WOUND): Gram Stain: NONE SEEN

## 2024-01-21 ENCOUNTER — Encounter: Payer: Self-pay | Admitting: Internal Medicine

## 2024-01-21 ENCOUNTER — Ambulatory Visit: Payer: Self-pay | Admitting: Internal Medicine

## 2024-01-21 ENCOUNTER — Other Ambulatory Visit: Payer: Self-pay

## 2024-01-21 VITALS — BP 137/72 | HR 80 | Resp 16 | Ht 59.0 in | Wt 98.4 lb

## 2024-01-21 DIAGNOSIS — M869 Osteomyelitis, unspecified: Secondary | ICD-10-CM

## 2024-01-21 MED ORDER — FLUCONAZOLE 100 MG PO TABS: 100.0000 mg | ORAL_TABLET | Freq: Two times a day (BID) | ORAL | 5 refills | Status: DC

## 2024-01-21 NOTE — Patient Instructions (Signed)
 Stop doxycyline and augmentin  Start fluconazole 100mg  po bid F/u on 6/5

## 2024-01-21 NOTE — Progress Notes (Unsigned)
     Patient: GARY BULTMAN  DOB: 09-Jun-1941 MRN: [id number] PCP: Jearldine Mina, MD  Referring Provider: ***  No chief complaint on file.    Patient Active Problem List   Diagnosis Date Noted   Pigmented skin lesion of lower extremity 12/09/2023   Subacute osteomyelitis of left foot (HCC) 12/09/2023   Chronic pain in left foot 11/13/2023   Open wound 11/13/2023   Melanoma in situ of left lower extremity (HCC) 05/17/2023   Melanoma of foot, left (HCC) 12/25/2022     Subjective:  CHARLISE GIOVANETTI is a 83 y.o. @GENDER @ with   ROS  Past Medical History:  Diagnosis Date   Frequent headaches    Osteoporosis    UTI (urinary tract infection)     Outpatient Medications Prior to Visit  Medication Sig Dispense Refill   amoxicillin -clavulanate (AUGMENTIN ) 875-125 MG tablet Take 1 tablet by mouth 2 (two) times daily. 84 tablet 0   aspirin EC 81 MG tablet Take 81 mg by mouth daily. Swallow whole.     Cholecalciferol (VITAMIN D3 MAXIMUM STRENGTH) 125 MCG (5000 UT) capsule Take 5,000 Units by mouth daily.     doxycycline  (VIBRA -TABS) 100 MG tablet Take 1 tablet (100 mg total) by mouth 2 (two) times daily. 84 tablet 0   gabapentin (NEURONTIN) 100 MG capsule Take 200 mg by mouth at bedtime.     ibuprofen (ADVIL) 200 MG tablet Take 400 mg by mouth every 6 (six) hours as needed for mild pain (pain score 1-3).     oxyCODONE  (OXY IR/ROXICODONE ) 5 MG immediate release tablet Take 1 tablet (5 mg total) by mouth every 6 (six) hours as needed for severe pain (pain score 7-10). 25 tablet 0   No facility-administered medications prior to visit.     No Known Allergies  Social History   Tobacco Use   Smoking status: Never   Smokeless tobacco: Never  Vaping Use   Vaping status: Never Used  Substance Use Topics   Alcohol use: No   Drug use: Never    History reviewed. No pertinent family history.  Objective:   Vitals:   01/21/24 1447  BP: 137/72  Pulse: 80  Resp: 16  SpO2: 99%   Weight: 98 lb 6.4 oz (44.6 kg)  Height: 4\' 11"  (1.499 m)   Body mass index is 19.87 kg/m.  Physical Exam  Lab Results: Lab Results  Component Value Date   WBC 5.3 12/12/2023   HGB 12.3 12/12/2023   HCT 38.0 12/12/2023   MCV 93.6 12/12/2023   PLT 251 12/12/2023    Lab Results  Component Value Date   CREATININE 0.79 12/12/2023   BUN 18 12/12/2023   NA 140 12/12/2023   K 4.3 12/12/2023   CL 106 12/12/2023   CO2 27 12/12/2023    Lab Results  Component Value Date   ALT 19 12/12/2023   AST 24 12/12/2023   ALKPHOS 76 02/16/2022   BILITOT 0.4 12/12/2023     Assessment & Plan:   Problem List Items Addressed This Visit   None -pt took doxy+ augmetin 4-5 days ago.  -called [employer] to add sens. Given sens to fluconzole will treat 6mg /kg (262 mg/k 0cr cle 39(baseline). As such wil do 100mg bid  *** will return to clinic in *** {weeks/months} for follow up  Orlie Bjornstad, MD [facility]   01/21/24  2:56 PM

## 2024-01-28 LAB — YEAST SUSCEPTIBILITIES
Amphotericin B MIC: 1
Anidulafungin MIC: 2
Caspofungin MIC: 0.5
Fluconazole Islt MIC: 64
ISAVUCONAZOLE MIC: 0.015
Itraconazole MIC: 0.12
Micafungin MIC: 1
Posaconazole MIC: 0.06
REZAFUNGIN MIC: 1
Voriconazole MIC: 0.25

## 2024-02-05 LAB — FUNGUS CULTURE RESULT

## 2024-02-05 LAB — FUNGUS CULTURE WITH STAIN

## 2024-02-05 LAB — FUNGAL ORGANISM REFLEX

## 2024-02-06 ENCOUNTER — Other Ambulatory Visit (HOSPITAL_COMMUNITY): Payer: Self-pay

## 2024-02-06 ENCOUNTER — Other Ambulatory Visit: Payer: Self-pay

## 2024-02-06 ENCOUNTER — Ambulatory Visit: Admitting: Internal Medicine

## 2024-02-06 ENCOUNTER — Telehealth: Payer: Self-pay

## 2024-02-06 VITALS — BP 160/71 | HR 73 | Temp 97.8°F | Ht 59.0 in | Wt 97.0 lb

## 2024-02-06 DIAGNOSIS — M86172 Other acute osteomyelitis, left ankle and foot: Secondary | ICD-10-CM | POA: Diagnosis not present

## 2024-02-06 DIAGNOSIS — B372 Candidiasis of skin and nail: Secondary | ICD-10-CM | POA: Diagnosis not present

## 2024-02-06 DIAGNOSIS — B9689 Other specified bacterial agents as the cause of diseases classified elsewhere: Secondary | ICD-10-CM | POA: Diagnosis not present

## 2024-02-06 DIAGNOSIS — M869 Osteomyelitis, unspecified: Secondary | ICD-10-CM

## 2024-02-06 NOTE — Progress Notes (Unsigned)
 Patient Active Problem List   Diagnosis Date Noted   Pigmented skin lesion of lower extremity 12/09/2023   Subacute osteomyelitis of left foot (HCC) 12/09/2023   Chronic pain in left foot 11/13/2023   Open wound 11/13/2023   Melanoma in situ of left lower extremity (HCC) 05/17/2023   Melanoma of foot, left (HCC) 12/25/2022    Patient's Medications  New Prescriptions   No medications on file  Previous Medications   ASPIRIN EC 81 MG TABLET    Take 81 mg by mouth daily. Swallow whole.   CHOLECALCIFEROL (VITAMIN D3 MAXIMUM STRENGTH) 125 MCG (5000 UT) CAPSULE    Take 5,000 Units by mouth daily.   FLUCONAZOLE  (DIFLUCAN ) 100 MG TABLET    Take 1 tablet (100 mg total) by mouth in the morning and at bedtime.   GABAPENTIN (NEURONTIN) 100 MG CAPSULE    Take 200 mg by mouth at bedtime.   IBUPROFEN (ADVIL) 200 MG TABLET    Take 400 mg by mouth every 6 (six) hours as needed for mild pain (pain score 1-3).   OXYCODONE  (OXY IR/ROXICODONE ) 5 MG IMMEDIATE RELEASE TABLET    Take 1 tablet (5 mg total) by mouth every 6 (six) hours as needed for severe pain (pain score 7-10).  Modified Medications   No medications on file  Discontinued Medications   No medications on file    Subjective: Regina Moody is an 83 year old female with a history of malignant melanoma of the left foot who presents with concern for left foot osteomyelitis.   She has ongoing issues with her left foot, including a sensation of coldness on the dorsum despite feeling warm, and pain on the left lateral foot. An MRI of the left foot showed concern for fifth toe osteomyelitis. She was prescribed Bactrim, which she took for a total of 20 days, with a gap of about a month between courses. The redness and pain have improved since starting the antibiotics.   Her history of malignant melanoma of the left foot was initially diagnosed in April 2024. The melanoma was located on the left plantar aspect and measured 2.4 mm  with ulceration and a mitotic index of zero. She underwent a wide local excision with skin substitution coverage and sentinel node biopsy on Jan 15, 2023, which showed positive margins for melanoma in situ. A subsequent surgery included left fifth toe amputation, with margins still positive for melanoma in situ but less extensive.   She has also experienced new pigmented lesions and a rash on her foot, for which a punch biopsy confirmed melanoma in situ. She underwent Mohs surgery on June 10, 2023, followed by skin substitute coverage on June 27, 2023. The pain has been debilitating, affecting her mood and quality of life.   No fevers or chills. Redness around the edges of the affected area and shooting pain down the side of her foot. She has been using collagen and an antibiotic ointment on the area.      12/19/23: Doing well no new complaints. Has not started abx 12/31/23: PT started abx due to redness and swelling.  5/20  underwent biopsy following doxy and augmentin  was started Today : tolerating abx. No new ocmplaints   Review of Systems: Review of Systems  All other systems reviewed and are negative.   Past Medical History:  Diagnosis Date   Frequent headaches    Osteoporosis    UTI (urinary tract infection)  Social History   Tobacco Use   Smoking status: Never   Smokeless tobacco: Never  Vaping Use   Vaping status: Never Used  Substance Use Topics   Alcohol use: No   Drug use: Never    No family history on file.  No Known Allergies  Health Maintenance  Topic Date Due   DTaP/Tdap/Td (1 - Tdap) Never done   Pneumonia Vaccine 32+ Years old (1 of 1 - PCV) Never done   COVID-19 Vaccine (4 - 2024-25 season) 05/05/2023   INFLUENZA VACCINE  04/03/2024   Medicare Annual Wellness (AWV)  04/15/2024   DEXA SCAN  Completed   Zoster Vaccines- Shingrix  Completed   HPV VACCINES  Aged Out   Meningococcal B Vaccine  Aged Out    Objective:  Vitals:   02/06/24 0947   BP: (!) 160/71  Pulse: 73  Temp: 97.8 F (36.6 C)  TempSrc: Temporal  SpO2: 98%  Weight: 97 lb (44 kg)  Height: 4' 11 (1.499 m)   Body mass index is 19.59 kg/m.  Physical Exam Constitutional:      Appearance: Normal appearance.  HENT:     Head: Normocephalic and atraumatic.     Right Ear: Tympanic membrane normal.     Left Ear: Tympanic membrane normal.     Nose: Nose normal.     Mouth/Throat:     Mouth: Mucous membranes are moist.  Eyes:     Extraocular Movements: Extraocular movements intact.     Conjunctiva/sclera: Conjunctivae normal.     Pupils: Pupils are equal, round, and reactive to light.  Cardiovascular:     Rate and Rhythm: Normal rate and regular rhythm.     Heart sounds: No murmur heard.    No friction rub. No gallop.  Pulmonary:     Effort: Pulmonary effort is normal.     Breath sounds: Normal breath sounds.  Abdominal:     General: Abdomen is flat.     Palpations: Abdomen is soft.  Musculoskeletal:        General: Normal range of motion.  Skin:    General: Skin is warm and dry.  Neurological:     General: No focal deficit present.     Mental Status: She is alert and oriented to person, place, and time.  Psychiatric:        Mood and Affect: Mood normal.    Lab Results Lab Results  Component Value Date   WBC 5.3 12/12/2023   HGB 12.3 12/12/2023   HCT 38.0 12/12/2023   MCV 93.6 12/12/2023   PLT 251 12/12/2023    Lab Results  Component Value Date   CREATININE 0.79 12/12/2023   BUN 18 12/12/2023   NA 140 12/12/2023   K 4.3 12/12/2023   CL 106 12/12/2023   CO2 27 12/12/2023    Lab Results  Component Value Date   ALT 19 12/12/2023   AST 24 12/12/2023   ALKPHOS 76 02/16/2022   BILITOT 0.4 12/12/2023    No results found for: CHOL, HDL, LDLCALC, LDLDIRECT, TRIG, CHOLHDL Lab Results  Component Value Date   LABRPR Non Reactive 08/11/2019   No results found for: HIV1RNAQUANT, HIV1RNAVL, CD4TABS   Problem List Items  Addressed This Visit   None  Results   Assessment/Plan #Left fifth metatarsal osteo with candida on fungal and bacterial cx #Hx of malignant malenoma of left 5th toe sp biopsy with  -pt took doxy+ augmetin 4-5 days and had bone biopsy on 5/7(due to increased  erythema) with general surgery. Bx cx+ candida orthopsilosis -candida with mic 64 t to fluconazole , Discussed with pharmacy, PA for itra sent Plan: -Continue fluconzole will switch to itraconazole 200 mg bid pending PA.  Labs today F/u in one month    Orlie Bjornstad, MD [facility] for Infectious Disease [facility] 02/06/2024, 10:06 AM  I have personally spent 62 minutes involved in face-to-face and non-face-to-face activities for this patient on the day of the visit. Professional time spent includes the following activities: Preparing to see the patient (review of tests), Obtaining and/or reviewing separately obtained history (admission/discharge record), Performing a medically appropriate examination and/or evaluation , Ordering medications/tests/procedures, referring and communicating with other health care professionals, Documenting clinical information in the EMR, Independently interpreting results (not separately reported), Communicating results to the patient/family/caregiver, Counseling and educating the patient/family/caregiver and Care coordination (not separately reported).

## 2024-02-06 NOTE — Telephone Encounter (Signed)
 Pharmacy Patient Advocate Encounter   Received notification from CoverMyMeds that prior authorization for ITRACONAZOLE TABLETS is required/requested.   Insurance verification completed.   The patient is insured through [facility] ADVANTAGE/RX ADVANCE .   Per test claim: PA required; PA submitted to above mentioned insurance via CoverMyMeds Key/confirmation #/EOC [id number] Status is pending   CLINICAL QUESTIONS ANSWERED

## 2024-02-07 ENCOUNTER — Ambulatory Visit: Payer: Self-pay | Admitting: Internal Medicine

## 2024-02-07 ENCOUNTER — Telehealth: Payer: Self-pay

## 2024-02-07 LAB — CBC WITH DIFFERENTIAL/PLATELET
Absolute Lymphocytes: 1658 {cells}/uL (ref 850–3900)
Absolute Monocytes: 621 {cells}/uL (ref 200–950)
Basophils Absolute: 38 {cells}/uL (ref 0–200)
Basophils Relative: 0.6 %
Eosinophils Absolute: 147 {cells}/uL (ref 15–500)
Eosinophils Relative: 2.3 %
HCT: 37.7 % (ref 35.0–45.0)
Hemoglobin: 12.4 g/dL (ref 11.7–15.5)
MCH: 31.1 pg (ref 27.0–33.0)
MCHC: 32.9 g/dL (ref 32.0–36.0)
MCV: 94.5 fL (ref 80.0–100.0)
MPV: 13.3 fL — ABNORMAL HIGH (ref 7.5–12.5)
Monocytes Relative: 9.7 %
Neutro Abs: 3936 {cells}/uL (ref 1500–7800)
Neutrophils Relative %: 61.5 %
Platelets: 195 10*3/uL (ref 140–400)
RBC: 3.99 10*6/uL (ref 3.80–5.10)
RDW: 13.1 % (ref 11.0–15.0)
Total Lymphocyte: 25.9 %
WBC: 6.4 10*3/uL (ref 3.8–10.8)

## 2024-02-07 LAB — COMPLETE METABOLIC PANEL WITHOUT GFR
AG Ratio: 1.4 (calc) (ref 1.0–2.5)
ALT: 13 U/L (ref 6–29)
AST: 21 U/L (ref 10–35)
Albumin: 4 g/dL (ref 3.6–5.1)
Alkaline phosphatase (APISO): 79 U/L (ref 37–153)
BUN: 18 mg/dL (ref 7–25)
CO2: 26 mmol/L (ref 20–32)
Calcium: 9.9 mg/dL (ref 8.6–10.4)
Chloride: 103 mmol/L (ref 98–110)
Creat: 0.69 mg/dL (ref 0.60–0.95)
Globulin: 2.8 g/dL (ref 1.9–3.7)
Glucose, Bld: 80 mg/dL (ref 65–99)
Potassium: 3.9 mmol/L (ref 3.5–5.3)
Sodium: 141 mmol/L (ref 135–146)
Total Bilirubin: 0.4 mg/dL (ref 0.2–1.2)
Total Protein: 6.8 g/dL (ref 6.1–8.1)

## 2024-02-07 LAB — C-REACTIVE PROTEIN: CRP: <3 mg/L (ref <8.0)

## 2024-02-07 LAB — SEDIMENTATION RATE: Sed Rate: 22 mm/h (ref 0–30)

## 2024-02-07 NOTE — Telephone Encounter (Signed)
 Patient called requesting lab results from 6/5.  Will forward message to MD.  Julien Odor, RMA

## 2024-02-07 NOTE — Progress Notes (Signed)
 Called pt relayed stable labs

## 2024-02-07 NOTE — Telephone Encounter (Signed)
 Received denial letter from insurance today stating patient's diagnosis does not meet their therapeutic criteria. Filled out reconsideration/appeal form today since one of the options is "systemic fungal infection". Shared culture results as well. Will await response.  Nicklas Barns, PharmD, CPP, BCIDP, AAHIVP Clinical Pharmacist Practitioner Infectious Diseases Clinical Pharmacist [facility] for Infectious Disease

## 2024-02-10 ENCOUNTER — Other Ambulatory Visit (HOSPITAL_COMMUNITY): Payer: Self-pay

## 2024-02-11 ENCOUNTER — Other Ambulatory Visit (HOSPITAL_COMMUNITY): Payer: Self-pay

## 2024-02-12 ENCOUNTER — Telehealth: Payer: Self-pay | Admitting: Pharmacist

## 2024-02-12 ENCOUNTER — Other Ambulatory Visit (HOSPITAL_COMMUNITY): Payer: Self-pay

## 2024-02-12 DIAGNOSIS — M869 Osteomyelitis, unspecified: Secondary | ICD-10-CM

## 2024-02-12 MED ORDER — ITRACONAZOLE 100 MG PO CAPS
200.0000 mg | ORAL_CAPSULE | Freq: Every day | ORAL | 5 refills | Status: AC
Start: 2024-02-12 — End: ?

## 2024-02-12 NOTE — Telephone Encounter (Signed)
 Pharmacy Patient Advocate Encounter  Received notification from [facility] ADVANTAGE/RX ADVANCE that Prior Authorization for ITRACONAZOLE CAPSULES has been APPROVED from 02/12/24 to 08/09/24. Ran test claim, Copay is $34.76. This test claim was processed through [facility]- copay amounts may vary at other pharmacies due to pharmacy/plan contracts, or as the patient moves through the different stages of their insurance plan.   PA #/Case ID/Reference #: [id number]

## 2024-02-12 NOTE — Telephone Encounter (Signed)
 Sent prescription and reviewed counseling with patient. Thanks Jearlean Mince!

## 2024-02-12 NOTE — Telephone Encounter (Signed)
 Patient is approved for itraconazole therapy; cost will be ~$35 per month. Sent prescription to preferred pharmacy. Reviewed importance of taking 2 capsules once daily with food. Patient verbalized understanding. Potential for increased oxycodone  concentrations leading to more fatigue; she denies taking these. Denies taking any acid reflux medications or drinking grapefruit juice consistently which could alter drug concentrations. All questions answered. Will follow up with Dr. Zelda Hickman on 7/10.  Nicklas Barns, PharmD, CPP, BCIDP, AAHIVP Clinical Pharmacist Practitioner Infectious Diseases Clinical Pharmacist [facility] for Infectious Disease

## 2024-02-13 ENCOUNTER — Other Ambulatory Visit (HOSPITAL_COMMUNITY): Payer: Self-pay

## 2024-02-22 LAB — ACID FAST CULTURE WITH REFLEXED SENSITIVITIES (MYCOBACTERIA): Acid Fast Culture: NEGATIVE

## 2024-03-12 ENCOUNTER — Encounter: Payer: Self-pay | Admitting: Internal Medicine

## 2024-03-12 ENCOUNTER — Ambulatory Visit: Admitting: Internal Medicine

## 2024-03-12 ENCOUNTER — Other Ambulatory Visit: Payer: Self-pay

## 2024-03-12 VITALS — BP 153/67 | HR 66 | Temp 97.6°F | Ht 59.0 in | Wt 99.0 lb

## 2024-03-12 DIAGNOSIS — M869 Osteomyelitis, unspecified: Secondary | ICD-10-CM | POA: Diagnosis not present

## 2024-03-12 NOTE — Progress Notes (Signed)
 Patient Active Problem List   Diagnosis Date Noted   Pigmented skin lesion of lower extremity 12/09/2023   Subacute osteomyelitis of left foot (HCC) 12/09/2023   Chronic pain in left foot 11/13/2023   Open wound 11/13/2023   Melanoma in situ of left lower extremity (HCC) 05/17/2023   Melanoma of foot, left (HCC) 12/25/2022    Patient's Medications  New Prescriptions   No medications on file  Previous Medications   ASPIRIN EC 81 MG TABLET    Take 81 mg by mouth daily. Swallow whole.   CHOLECALCIFEROL (VITAMIN D3 MAXIMUM STRENGTH) 125 MCG (5000 UT) CAPSULE    Take 5,000 Units by mouth daily.   GABAPENTIN (NEURONTIN) 100 MG CAPSULE    Take 200 mg by mouth at bedtime.   IBUPROFEN (ADVIL) 200 MG TABLET    Take 400 mg by mouth every 6 (six) hours as needed for mild pain (pain score 1-3).   ITRACONAZOLE  (SPORANOX ) 100 MG CAPSULE    Take 2 capsules (200 mg total) by mouth daily with lunch.   OXYCODONE  (OXY IR/ROXICODONE ) 5 MG IMMEDIATE RELEASE TABLET    Take 1 tablet (5 mg total) by mouth every 6 (six) hours as needed for severe pain (pain score 7-10).  Modified Medications   No medications on file  Discontinued Medications   No medications on file    Subjective: Regina Moody is an 83 year old female with a history of malignant melanoma of the left foot who presents with concern for left foot osteomyelitis.   She has ongoing issues with her left foot, including a sensation of coldness on the dorsum despite feeling warm, and pain on the left lateral foot. An MRI of the left foot showed concern for fifth toe osteomyelitis. She was prescribed Bactrim, which she took for a total of 20 days, with a gap of about a month between courses. The redness and pain have improved since starting the antibiotics.   Her history of malignant melanoma of the left foot was initially diagnosed in April 2024. The melanoma was located on the left plantar aspect and measured 2.4 mm with  ulceration and a mitotic index of zero. She underwent a wide local excision with skin substitution coverage and sentinel node biopsy on Jan 15, 2023, which showed positive margins for melanoma in situ. A subsequent surgery included left fifth toe amputation, with margins still positive for melanoma in situ but less extensive.   She has also experienced new pigmented lesions and a rash on her foot, for which a punch biopsy confirmed melanoma in situ. She underwent Mohs surgery on June 10, 2023, followed by skin substitute coverage on June 27, 2023. The pain has been debilitating, affecting her mood and quality of life.   No fevers or chills. Redness around the edges of the affected area and shooting pain down the side of her foot. She has been using collagen and an antibiotic ointment on the area.      12/19/23: Doing well no new complaints. Has not started abx 12/31/23: PT started abx due to redness and swelling.  5/20  underwent biopsy following doxy and augmentin  was started 6/5 : tolerating abx. No new complaints 7/10: Doing well. No new complaints tolerating itraconazole .  Review of Systems: Review of Systems  All other systems reviewed and are negative.   Past Medical History:  Diagnosis Date   Frequent headaches    Osteoporosis    UTI (  urinary tract infection)     Social History   Tobacco Use   Smoking status: Never   Smokeless tobacco: Never  Vaping Use   Vaping status: Never Used  Substance Use Topics   Alcohol use: No   Drug use: Never    No family history on file.  No Known Allergies  Health Maintenance  Topic Date Due   DTaP/Tdap/Td (1 - Tdap) Never done   COVID-19 Vaccine (4 - 2024-25 season) 05/05/2023   Medicare Annual Wellness (AWV)  04/15/2024   INFLUENZA VACCINE  04/03/2024   Pneumococcal Vaccine: 50+ Years  Completed   DEXA SCAN  Completed   Zoster Vaccines- Shingrix  Completed   Hepatitis B Vaccines  Aged Out   HPV VACCINES  Aged Out    Meningococcal B Vaccine  Aged Out    Objective:  Vitals:   03/12/24 1107  BP: (!) 153/67  Pulse: 66  Temp: 97.6 F (36.4 C)  TempSrc: Temporal  SpO2: 98%  Weight: 99 lb (44.9 kg)  Height: 4' 11 (1.499 m)   Body mass index is 20 kg/m.  Physical Exam Constitutional:      Appearance: Normal appearance.  HENT:     Head: Normocephalic and atraumatic.     Right Ear: Tympanic membrane normal.     Left Ear: Tympanic membrane normal.     Nose: Nose normal.     Mouth/Throat:     Mouth: Mucous membranes are moist.  Eyes:     Extraocular Movements: Extraocular movements intact.     Conjunctiva/sclera: Conjunctivae normal.     Pupils: Pupils are equal, round, and reactive to light.  Cardiovascular:     Rate and Rhythm: Normal rate and regular rhythm.     Heart sounds: No murmur heard.    No friction rub. No gallop.  Pulmonary:     Effort: Pulmonary effort is normal.     Breath sounds: Normal breath sounds.  Abdominal:     General: Abdomen is flat.     Palpations: Abdomen is soft.  Skin:    General: Skin is warm and dry.  Neurological:     General: No focal deficit present.     Mental Status: She is alert and oriented to person, place, and time.  Psychiatric:        Mood and Affect: Mood normal.    Lab Results Lab Results  Component Value Date   WBC 6.4 02/06/2024   HGB 12.4 02/06/2024   HCT 37.7 02/06/2024   MCV 94.5 02/06/2024   PLT 195 02/06/2024    Lab Results  Component Value Date   CREATININE 0.69 02/06/2024   BUN 18 02/06/2024   NA 141 02/06/2024   K 3.9 02/06/2024   CL 103 02/06/2024   CO2 26 02/06/2024    Lab Results  Component Value Date   ALT 13 02/06/2024   AST 21 02/06/2024   ALKPHOS 76 02/16/2022   BILITOT 0.4 02/06/2024    No results found for: CHOL, HDL, LDLCALC, LDLDIRECT, TRIG, CHOLHDL Lab Results  Component Value Date   LABRPR Non Reactive 08/11/2019   No results found for: HIV1RNAQUANT, HIV1RNAVL, CD4TABS    Problem List Items Addressed This Visit   None  Results   Assessment/Plan #Left fifth metatarsal osteo with candida on fungal and bacterial cx #Hx of malignant malenoma of left 5th toe sp biopsy with  -pt took doxy+ augmetin 4-5 days and had bone biopsy on 5/7(due to increased erythema) with general surgery. Bx  cx+ candida orthopsilosis -candida with mic 64 t to fluconazole , started itraconazole  Plan  Tolerating itraconazole , wound helaing. Started 6/11-> plan on 6 months 12/11(EOT). 03/11/24 EKG wth qtc 395 Labs today Wound healing F/U in 5 months.     Loney Stank, MD [facility] 03/12/2024, 11:14 AM

## 2024-03-13 LAB — CBC WITH DIFFERENTIAL/PLATELET
Absolute Lymphocytes: 1581 {cells}/uL (ref 850–3900)
Absolute Monocytes: 536 {cells}/uL (ref 200–950)
Basophils Absolute: 40 {cells}/uL (ref 0–200)
Basophils Relative: 0.6 %
Eosinophils Absolute: 47 {cells}/uL (ref 15–500)
Eosinophils Relative: 0.7 %
HCT: 37.8 % (ref 35.0–45.0)
Hemoglobin: 12.4 g/dL (ref 11.7–15.5)
MCH: 31.1 pg (ref 27.0–33.0)
MCHC: 32.8 g/dL (ref 32.0–36.0)
MCV: 94.7 fL (ref 80.0–100.0)
MPV: 13.4 fL — ABNORMAL HIGH (ref 7.5–12.5)
Monocytes Relative: 8 %
Neutro Abs: 4496 {cells}/uL (ref 1500–7800)
Neutrophils Relative %: 67.1 %
Platelets: 187 Thousand/uL (ref 140–400)
RBC: 3.99 Million/uL (ref 3.80–5.10)
RDW: 13.2 % (ref 11.0–15.0)
Total Lymphocyte: 23.6 %
WBC: 6.7 Thousand/uL (ref 3.8–10.8)

## 2024-03-13 LAB — COMPLETE METABOLIC PANEL WITHOUT GFR
AG Ratio: 1.8 (calc) (ref 1.0–2.5)
ALT: 15 U/L (ref 6–29)
AST: 24 U/L (ref 10–35)
Albumin: 4.2 g/dL (ref 3.6–5.1)
Alkaline phosphatase (APISO): 80 U/L (ref 37–153)
BUN: 15 mg/dL (ref 7–25)
CO2: 28 mmol/L (ref 20–32)
Calcium: 9.4 mg/dL (ref 8.6–10.4)
Chloride: 105 mmol/L (ref 98–110)
Creat: 0.65 mg/dL (ref 0.60–0.95)
Globulin: 2.4 g/dL (ref 1.9–3.7)
Glucose, Bld: 77 mg/dL (ref 65–99)
Potassium: 4 mmol/L (ref 3.5–5.3)
Sodium: 141 mmol/L (ref 135–146)
Total Bilirubin: 0.5 mg/dL (ref 0.2–1.2)
Total Protein: 6.6 g/dL (ref 6.1–8.1)

## 2024-03-13 LAB — C-REACTIVE PROTEIN: CRP: <3 mg/L (ref <8.0)

## 2024-03-13 LAB — SEDIMENTATION RATE: Sed Rate: 14 mm/h (ref 0–30)

## 2024-03-20 ENCOUNTER — Telehealth: Payer: Self-pay

## 2024-03-20 NOTE — Telephone Encounter (Signed)
 Patient called wanting provider to review lab work from 7/10 appointment.   Regina Moody, BSN, RN

## 2024-04-01 ENCOUNTER — Other Ambulatory Visit: Payer: Self-pay | Admitting: Internal Medicine

## 2024-04-01 DIAGNOSIS — Z1231 Encounter for screening mammogram for malignant neoplasm of breast: Secondary | ICD-10-CM

## 2024-04-02 ENCOUNTER — Telehealth: Payer: Self-pay

## 2024-04-02 NOTE — Telephone Encounter (Signed)
 error

## 2024-04-20 ENCOUNTER — Ambulatory Visit
Admission: RE | Admit: 2024-04-20 | Discharge: 2024-04-20 | Disposition: A | Discharge status: Home / Self Care | Source: Ambulatory Visit | Attending: Internal Medicine | Admitting: Internal Medicine

## 2024-04-20 DIAGNOSIS — Z1231 Encounter for screening mammogram for malignant neoplasm of breast: Secondary | ICD-10-CM | POA: Diagnosis not present

## 2024-05-05 DIAGNOSIS — Z961 Presence of intraocular lens: Secondary | ICD-10-CM | POA: Diagnosis not present

## 2024-05-05 DIAGNOSIS — D3132 Benign neoplasm of left choroid: Secondary | ICD-10-CM | POA: Diagnosis not present

## 2024-05-05 DIAGNOSIS — H353121 Nonexudative age-related macular degeneration, left eye, early dry stage: Secondary | ICD-10-CM | POA: Diagnosis not present

## 2024-05-11 DIAGNOSIS — Z1331 Encounter for screening for depression: Secondary | ICD-10-CM | POA: Diagnosis not present

## 2024-05-11 DIAGNOSIS — N3020 Other chronic cystitis without hematuria: Secondary | ICD-10-CM | POA: Diagnosis not present

## 2024-05-11 DIAGNOSIS — M86272 Subacute osteomyelitis, left ankle and foot: Secondary | ICD-10-CM | POA: Diagnosis not present

## 2024-05-11 DIAGNOSIS — S98139A Complete traumatic amputation of one unspecified lesser toe, initial encounter: Secondary | ICD-10-CM | POA: Diagnosis not present

## 2024-05-11 DIAGNOSIS — M810 Age-related osteoporosis without current pathological fracture: Secondary | ICD-10-CM | POA: Diagnosis not present

## 2024-05-11 DIAGNOSIS — E7800 Pure hypercholesterolemia, unspecified: Secondary | ICD-10-CM | POA: Diagnosis not present

## 2024-05-11 DIAGNOSIS — Z0000 Encounter for general adult medical examination without abnormal findings: Secondary | ICD-10-CM | POA: Diagnosis not present

## 2024-05-11 DIAGNOSIS — I700 Atherosclerosis of aorta: Secondary | ICD-10-CM | POA: Diagnosis not present

## 2024-05-11 DIAGNOSIS — I739 Peripheral vascular disease, unspecified: Secondary | ICD-10-CM | POA: Diagnosis not present

## 2024-05-11 DIAGNOSIS — C439 Malignant melanoma of skin, unspecified: Secondary | ICD-10-CM | POA: Diagnosis not present

## 2024-05-11 DIAGNOSIS — R030 Elevated blood-pressure reading, without diagnosis of hypertension: Secondary | ICD-10-CM | POA: Diagnosis not present

## 2024-05-11 DIAGNOSIS — Z23 Encounter for immunization: Secondary | ICD-10-CM | POA: Diagnosis not present

## 2024-06-24 DIAGNOSIS — Z08 Encounter for follow-up examination after completed treatment for malignant neoplasm: Secondary | ICD-10-CM | POA: Diagnosis not present

## 2024-06-24 DIAGNOSIS — Z85820 Personal history of malignant melanoma of skin: Secondary | ICD-10-CM | POA: Diagnosis not present

## 2024-06-24 DIAGNOSIS — L814 Other melanin hyperpigmentation: Secondary | ICD-10-CM | POA: Diagnosis not present

## 2024-06-24 DIAGNOSIS — L570 Actinic keratosis: Secondary | ICD-10-CM | POA: Diagnosis not present

## 2024-06-24 DIAGNOSIS — L821 Other seborrheic keratosis: Secondary | ICD-10-CM | POA: Diagnosis not present

## 2024-06-24 DIAGNOSIS — D225 Melanocytic nevi of trunk: Secondary | ICD-10-CM | POA: Diagnosis not present

## 2024-08-10 ENCOUNTER — Encounter: Payer: Self-pay | Admitting: Internal Medicine

## 2024-08-10 ENCOUNTER — Ambulatory Visit: Admitting: Internal Medicine

## 2024-08-10 ENCOUNTER — Other Ambulatory Visit: Payer: Self-pay

## 2024-08-10 VITALS — BP 191/63 | HR 70 | Temp 97.5°F | Ht 59.0 in | Wt 95.0 lb

## 2024-08-10 DIAGNOSIS — M869 Osteomyelitis, unspecified: Secondary | ICD-10-CM

## 2024-08-10 NOTE — Progress Notes (Unsigned)
 Patient: Regina Moody  DOB: May 30, 1941 MRN: [id number] PCP: Ransom Other, MD   No chief complaint on file.    Patient Active Problem List   Diagnosis Date Noted   Pigmented skin lesion of lower extremity 12/09/2023   Subacute osteomyelitis of left foot (HCC) 12/09/2023   Chronic pain in left foot 11/13/2023   Open wound 11/13/2023   Melanoma in situ of left lower extremity (HCC) 05/17/2023   Melanoma of foot, left (HCC) 12/25/2022     Subjective:  Regina Moody is an 83 year old female with a history of malignant melanoma of the left foot who presents with concern for left foot osteomyelitis.   She has ongoing issues with her left foot, including a sensation of coldness on the dorsum despite feeling warm, and pain on the left lateral foot. An MRI of the left foot showed concern for fifth toe osteomyelitis. She was prescribed Bactrim, which she took for a total of 20 days, with a gap of about a month between courses. The redness and pain have improved since starting the antibiotics.   Her history of malignant melanoma of the left foot was initially diagnosed in April 2024. The melanoma was located on the left plantar aspect and measured 2.4 mm with ulceration and a mitotic index of zero. She underwent a wide local excision with skin substitution coverage and sentinel node biopsy on Jan 15, 2023, which showed positive margins for melanoma in situ. A subsequent surgery included left fifth toe amputation, with margins still positive for melanoma in situ but less extensive.   She has also experienced new pigmented lesions and a rash on her foot, for which a punch biopsy confirmed melanoma in situ. She underwent Mohs surgery on June 10, 2023, followed by skin substitute coverage on June 27, 2023. The pain has been debilitating, affecting her mood and quality of life.   No fevers or chills. Redness around the edges of the affected area and shooting pain down the side of  her foot. She has been using collagen and an antibiotic ointment on the area.      12/19/23: Doing well no new complaints. Has not started abx 12/31/23: PT started abx due to redness and swelling.  5/20  underwent biopsy following doxy and augmentin  was started 6/5 : tolerating abx. No new complaints 7/10: Doing well. No new complaints tolerating itraconazole .  08/10/24:   Review of Systems  All other systems reviewed and are negative.   Past Medical History:  Diagnosis Date   Frequent headaches    Osteoporosis    UTI (urinary tract infection)     Outpatient Medications Prior to Visit  Medication Sig Dispense Refill   aspirin EC 81 MG tablet Take 81 mg by mouth daily. Swallow whole.     Cholecalciferol (VITAMIN D3 MAXIMUM STRENGTH) 125 MCG (5000 UT) capsule Take 5,000 Units by mouth daily.     gabapentin (NEURONTIN) 100 MG capsule Take 200 mg by mouth at bedtime.     ibuprofen (ADVIL) 200 MG tablet Take 400 mg by mouth every 6 (six) hours as needed for mild pain (pain score 1-3).     itraconazole  (SPORANOX ) 100 MG capsule Take 2 capsules (200 mg total) by mouth daily with lunch. 60 capsule 5   oxyCODONE  (OXY IR/ROXICODONE ) 5 MG immediate release tablet Take 1 tablet (5 mg total) by mouth every 6 (six) hours as needed for severe pain (pain score 7-10). 25 tablet 0  No facility-administered medications prior to visit.     No Known Allergies  Social History   Tobacco Use   Smoking status: Never   Smokeless tobacco: Never  Vaping Use   Vaping status: Never Used  Substance Use Topics   Alcohol use: No   Drug use: Never    Family History  Problem Relation Age of Onset   Breast cancer Neg Hx     Objective:  There were no vitals filed for this visit. There is no height or weight on file to calculate BMI.  Physical Exam Constitutional:      Appearance: Normal appearance.  HENT:     Head: Normocephalic and atraumatic.     Right Ear: Tympanic membrane normal.     Left  Ear: Tympanic membrane normal.     Nose: Nose normal.     Mouth/Throat:     Mouth: Mucous membranes are moist.  Eyes:     Extraocular Movements: Extraocular movements intact.     Conjunctiva/sclera: Conjunctivae normal.     Pupils: Pupils are equal, round, and reactive to light.  Cardiovascular:     Rate and Rhythm: Normal rate and regular rhythm.     Heart sounds: No murmur heard.    No friction rub. No gallop.  Pulmonary:     Effort: Pulmonary effort is normal.     Breath sounds: Normal breath sounds.  Abdominal:     General: Abdomen is flat.     Palpations: Abdomen is soft.  Skin:    General: Skin is warm and dry.  Neurological:     General: No focal deficit present.     Mental Status: She is alert and oriented to person, place, and time.  Psychiatric:        Mood and Affect: Mood normal.     Lab Results: Lab Results  Component Value Date   WBC 6.7 03/12/2024   HGB 12.4 03/12/2024   HCT 37.8 03/12/2024   MCV 94.7 03/12/2024   PLT 187 03/12/2024    Lab Results  Component Value Date   CREATININE 0.65 03/12/2024   BUN 15 03/12/2024   NA 141 03/12/2024   K 4.0 03/12/2024   CL 105 03/12/2024   CO2 28 03/12/2024    Lab Results  Component Value Date   ALT 15 03/12/2024   AST 24 03/12/2024   ALKPHOS 76 02/16/2022   BILITOT 0.5 03/12/2024     Assessment & Plan:   #Left fifth metatarsal osteo with candida on fungal and bacterial cx #Hx of malignant malenoma of left 5th toe sp biopsy with  -pt took doxy+ augmetin 4-5 days and had bone biopsy on 5/7(due to increased erythema) with general surgery. Bx cx+ candida orthopsilosis -candida with mic 64  to fluconazole , as such started itraconazole  -wound healed and close dtoday 08/10/24. Started 6/11-> plan on 6 months 12/11(EOT). 03/11/24 EKG wth qtc 395 -03/12/24 labs stable Seen by general surgery on 04/17/2024 noted pain significantly improved.  Discharged from wound clinic.  F/U in 6 monts planned with Dr. Aron -stop  abx -cbc, cmp -F/U prn  Loney Stank, MD [facility]   08/10/24  9:14 AM

## 2024-08-11 ENCOUNTER — Ambulatory Visit: Payer: Self-pay | Admitting: Internal Medicine

## 2024-08-11 LAB — CBC WITH DIFFERENTIAL/PLATELET
Absolute Lymphocytes: 1547 {cells}/uL (ref 850–3900)
Absolute Monocytes: 630 {cells}/uL (ref 200–950)
Basophils Absolute: 28 {cells}/uL (ref 0–200)
Basophils Relative: 0.4 %
Eosinophils Absolute: 63 {cells}/uL (ref 15–500)
Eosinophils Relative: 0.9 %
HCT: 41.1 % (ref 35.9–46.0)
Hemoglobin: 13.8 g/dL (ref 11.7–15.5)
MCH: 31.2 pg (ref 27.0–33.0)
MCHC: 33.6 g/dL (ref 31.6–35.4)
MCV: 93 fL (ref 81.4–101.7)
MPV: 14 fL — ABNORMAL HIGH (ref 7.5–12.5)
Monocytes Relative: 9 %
Neutro Abs: 4732 {cells}/uL (ref 1500–7800)
Neutrophils Relative %: 67.6 %
Platelets: 172 Thousand/uL (ref 140–400)
RBC: 4.42 Million/uL (ref 3.80–5.10)
RDW: 13.6 % (ref 11.0–15.0)
Total Lymphocyte: 22.1 %
WBC: 7 Thousand/uL (ref 3.8–10.8)

## 2024-08-11 LAB — COMPLETE METABOLIC PANEL WITHOUT GFR
AG Ratio: 1.6 (calc) (ref 1.0–2.5)
ALT: 15 U/L (ref 6–29)
AST: 25 U/L (ref 10–35)
Albumin: 4.2 g/dL (ref 3.6–5.1)
Alkaline phosphatase (APISO): 87 U/L (ref 37–153)
BUN: 15 mg/dL (ref 7–25)
CO2: 29 mmol/L (ref 20–32)
Calcium: 9.9 mg/dL (ref 8.6–10.4)
Chloride: 103 mmol/L (ref 98–110)
Creat: 0.71 mg/dL (ref 0.60–0.95)
Globulin: 2.6 g/dL (ref 1.9–3.7)
Glucose, Bld: 80 mg/dL (ref 65–99)
Potassium: 3.9 mmol/L (ref 3.5–5.3)
Sodium: 141 mmol/L (ref 135–146)
Total Bilirubin: 0.7 mg/dL (ref 0.2–1.2)
Total Protein: 6.8 g/dL (ref 6.1–8.1)

## 2024-08-31 ENCOUNTER — Ambulatory Visit
Admission: EM | Admit: 2024-08-31 | Discharge: 2024-08-31 | Disposition: A | Discharge status: Home / Self Care | Attending: Nurse Practitioner | Admitting: Nurse Practitioner

## 2024-08-31 DIAGNOSIS — J069 Acute upper respiratory infection, unspecified: Secondary | ICD-10-CM

## 2024-08-31 DIAGNOSIS — R059 Cough, unspecified: Secondary | ICD-10-CM

## 2024-08-31 LAB — POCT INFLUENZA A/B
Influenza A, POC: NEGATIVE
Influenza B, POC: NEGATIVE

## 2024-08-31 LAB — POC SOFIA SARS ANTIGEN FIA: SARS Coronavirus 2 Ag: NEGATIVE

## 2024-08-31 MED ORDER — FLUTICASONE PROPIONATE 50 MCG/ACT NA SUSP: 2.0000 | Freq: Every day | NASAL | 0 refills | Status: AC

## 2024-08-31 MED ORDER — BENZONATATE 100 MG PO CAPS: 100.0000 mg | ORAL_CAPSULE | Freq: Three times a day (TID) | ORAL | 0 refills | Status: AC | PRN

## 2024-08-31 NOTE — ED Triage Notes (Signed)
 Cough, headache, sore throat x 3 days. Taking mucinex.

## 2024-08-31 NOTE — Discharge Instructions (Addendum)
 The COVID and flu test were negative. Take medications as prescribed. Increase fluids and allow for plenty of rest. You may take over-the-counter Tylenol  as needed for pain, fever, or general discomfort. Recommend use of a humidifier in your bedroom at nighttime during sleep and sleeping elevated on pillows while symptoms persist. Your symptoms should begin to improve over the next 5 to 7 days.  If your symptoms fail to improve, or begin to worsen, you may follow-up in this clinic or with your primary care physician for further evaluation. Follow-up as needed.

## 2024-08-31 NOTE — ED Provider Notes (Signed)
 " [facility]    CSN: [id number] Arrival date & time: 08/31/24  1133      History   Chief Complaint Chief Complaint  Patient presents with   Cough    HPI Regina Moody is a 83 y.o. female.   The history is provided by the patient.   Patient presents for complaints of cough, chest congestion, sore throat, and headache.  Patient states symptoms have been present for the past 3 days.  She denies fever, chills, ear pain, ear drainage, wheezing, difficulty breathing, shortness of breath, abdominal pain, nausea, vomiting, diarrhea, or rash.  Patient states that she has been taking Mucinex for her symptoms along with Tylenol  or ibuprofen.  Past Medical History:  Diagnosis Date   Frequent headaches    Osteoporosis    UTI (urinary tract infection)     Patient Active Problem List   Diagnosis Date Noted   Pigmented skin lesion of lower extremity 12/09/2023   Subacute osteomyelitis of left foot (HCC) 12/09/2023   Chronic pain in left foot 11/13/2023   Open wound 11/13/2023   Melanoma in situ of left lower extremity (HCC) 05/17/2023   Melanoma of foot, left (HCC) 12/25/2022    Past Surgical History:  Procedure Laterality Date   ABDOMINAL HYSTERECTOMY     AMPUTATION TOE Left 02/21/2023   Procedure: LEFT 5TH TOE AMPUTATION;  Surgeon: Aron Shoulders, MD;  Location: [facility] OR;  Service: General;  Laterality: Left;   APLIGRAFT PLACEMENT Left 06/27/2023   Procedure: PLACEMENT OF SKIN SUBSTITUTE LEFT FOOT WOUND;  Surgeon: Aron Shoulders, MD;  Location: [facility];  Service: General;  Laterality: Left;   BONE BIOPSY Left 01/08/2024   Procedure: BIOPSY, BONE;  Surgeon: Aron Shoulders, MD;  Location: [facility] OR;  Service: General;  Laterality: Left;  LEFT 5TH METATARSAL BIOPSY   CESAREAN SECTION     EXCISION MELANOMA WITH SENTINEL LYMPH NODE BIOPSY N/A 01/15/2023   Procedure: WIDE LOCAL EXCISION OF LEFT FOOT MELANOMA PLACEMENT SKIN SUBSTITUTE WITH SENTINEL NODE BIOPSY;   Surgeon: Aron Shoulders, MD;  Location: [facility] OR;  Service: General;  Laterality: N/A;   MELANOMA EXCISION N/A 02/21/2023   Procedure: REEXCISION LEFT FOOT MELANOMA AND COVERAGE WITH SKIN SUBSTITUTE ([facility]);  Surgeon: Aron Shoulders, MD;  Location: [facility] OR;  Service: General;  Laterality: N/A;    OB History   No obstetric history on file.      Home Medications    Prior to Admission medications  Medication Sig Start Date End Date Taking? Authorizing Provider  alendronate (FOSAMAX) 70 MG tablet Take 70 mg by mouth once a week. 11/19/16  Yes [provider]  aspirin EC 81 MG tablet Take 81 mg by mouth daily. Swallow whole.   Yes [provider]  Cholecalciferol (VITAMIN D3 MAXIMUM STRENGTH) 125 MCG (5000 UT) capsule Take 5,000 Units by mouth daily.   Yes [provider]  ibuprofen (ADVIL) 200 MG tablet Take 400 mg by mouth every 6 (six) hours as needed for mild pain (pain score 1-3).    [provider]  itraconazole  (SPORANOX ) 100 MG capsule Take 2 capsules (200 mg total) by mouth daily with lunch. Patient not taking: Reported on 08/31/2024 02/12/24   Waddell Alan PARAS, RPH-CPP    Family History Family History  Problem Relation Age of Onset   Breast cancer Neg Hx     Social History Social History[1]   Allergies   Doxycycline    Review of Systems Review of Systems Per HPI  Physical Exam Triage Vital Signs ED Triage Vitals [08/31/24 1210]  Encounter Vitals Group     BP (!) 145/74     Girls Systolic BP Percentile      Girls Diastolic BP Percentile      Boys Systolic BP Percentile      Boys Diastolic BP Percentile      Pulse Rate 76     Resp 18     Temp 97.8 F (36.6 C)     Temp Source Oral     SpO2 98 %     Weight      Height      Head Circumference      Peak Flow      Pain Score 0     Pain Loc      Pain Education      Exclude from Growth Chart    No data found.  Updated Vital Signs BP (!) 145/74 (BP Location: Right Arm)   Pulse  76   Temp 97.8 F (36.6 C) (Oral)   Resp 18   SpO2 98%   Visual Acuity Right Eye Distance:   Left Eye Distance:   Bilateral Distance:    Right Eye Near:   Left Eye Near:    Bilateral Near:     Physical Exam Vitals and nursing note reviewed.  Constitutional:      General: She is not in acute distress.    Appearance: Normal appearance. She is well-developed.  HENT:     Head: Normocephalic and atraumatic.     Right Ear: Tympanic membrane, ear canal and external ear normal.     Left Ear: Tympanic membrane, ear canal and external ear normal.     Nose: Congestion present.     Right Turbinates: Enlarged and swollen.     Left Turbinates: Enlarged and swollen.     Right Sinus: No maxillary sinus tenderness or frontal sinus tenderness.     Left Sinus: No maxillary sinus tenderness or frontal sinus tenderness.     Mouth/Throat:     Lips: Pink.     Mouth: Mucous membranes are moist.     Pharynx: Uvula midline. Posterior oropharyngeal erythema and postnasal drip present. No pharyngeal swelling, oropharyngeal exudate or uvula swelling.     Comments: Cobblestoning present to posterior oropharynx  Eyes:     Extraocular Movements: Extraocular movements intact.     Conjunctiva/sclera: Conjunctivae normal.     Pupils: Pupils are equal, round, and reactive to light.  Neck:     Thyroid : No thyromegaly.     Trachea: No tracheal deviation.  Cardiovascular:     Rate and Rhythm: Normal rate and regular rhythm.     Pulses: Normal pulses.     Heart sounds: Normal heart sounds.  Pulmonary:     Effort: Pulmonary effort is normal. No respiratory distress.     Breath sounds: Normal breath sounds. No stridor. No wheezing, rhonchi or rales.  Abdominal:     General: Bowel sounds are normal.     Palpations: Abdomen is soft.     Tenderness: There is no abdominal tenderness.  Musculoskeletal:     Cervical back: Normal range of motion and neck supple.  Skin:    General: Skin is warm and dry.   Neurological:     General: No focal deficit present.     Mental Status: She is alert and oriented to person, place, and time.  Psychiatric:        Mood and Affect: Mood normal.  Behavior: Behavior normal.        Thought Content: Thought content normal.        Judgment: Judgment normal.      UC Treatments / Results  Labs (all labs ordered are listed, but only abnormal results are displayed) Labs Reviewed  POCT INFLUENZA A/B - Normal  POC SOFIA SARS ANTIGEN FIA    EKG   Radiology No results found.  Procedures Procedures (including critical care time)  Medications Ordered in UC Medications - No data to display  Initial Impression / Assessment and Plan / UC Course  I have reviewed the triage vital signs and the nursing notes.  Pertinent labs & imaging results that were available during my care of the patient were reviewed by me and considered in my medical decision making (see chart for details).  The influenza test and COVID test were negative.  On exam, the patient's lung sounds are clear throughout, room air sats are at 98%.  Patient is well-appearing, is in no acute distress, vital signs are stable.  She is well-appearing, symptoms consistent with a viral URI with cough.  Will provide symptomatic treatment with benzonatate  100 mg and fluticasone 50 mcg nasal spray.  Supportive care recommendations were provided and discussed with the patient to include fluids, rest, over-the-counter Tylenol , sleeping elevated, and use of a humidifier during sleep.  Discussed indications with the patient regarding follow-up.  Patient was in agreement with this plan of care and verbalizes understanding.  All questions were answered.  Patient stable for discharge.  Final Clinical Impressions(s) / UC Diagnoses   Final diagnoses:  Cough, unspecified type   Discharge Instructions   None    ED Prescriptions   None    PDMP not reviewed this encounter.    [1]  Social  History Tobacco Use   Smoking status: Never   Smokeless tobacco: Never  Vaping Use   Vaping status: Never Used  Substance Use Topics   Alcohol use: No   Drug use: Never     Gilmer Etta PARAS, NP 08/31/24 1335  "
